# Patient Record
Sex: Male | Born: 1960 | Race: White | Hispanic: No | Smoking: Current every day smoker
Health system: Southern US, Community
[De-identification: ages and names within clinical notes are randomized; demographics above are authoritative.]

## PROBLEM LIST (undated history)

## (undated) DIAGNOSIS — F1911 Other psychoactive substance abuse, in remission: Secondary | ICD-10-CM

## (undated) DIAGNOSIS — G4730 Sleep apnea, unspecified: Secondary | ICD-10-CM

## (undated) DIAGNOSIS — K759 Inflammatory liver disease, unspecified: Secondary | ICD-10-CM

## (undated) DIAGNOSIS — F32A Depression, unspecified: Secondary | ICD-10-CM

## (undated) DIAGNOSIS — F419 Anxiety disorder, unspecified: Secondary | ICD-10-CM

## (undated) DIAGNOSIS — F329 Major depressive disorder, single episode, unspecified: Secondary | ICD-10-CM

## (undated) HISTORY — PX: OTHER SURGICAL HISTORY: SHX169

## (undated) HISTORY — DX: Anxiety disorder, unspecified: F41.9

## (undated) HISTORY — DX: Depression, unspecified: F32.A

## (undated) HISTORY — PX: INCISION / DRAINAGE HAND / FINGER: SUR695

## (undated) HISTORY — DX: Sleep apnea, unspecified: G47.30

## (undated) HISTORY — DX: Major depressive disorder, single episode, unspecified: F32.9

---

## 2000-01-20 ENCOUNTER — Encounter: Payer: Self-pay | Admitting: Emergency Medicine

## 2000-01-20 ENCOUNTER — Emergency Department (HOSPITAL_COMMUNITY): Admission: EM | Admit: 2000-01-20 | Discharge: 2000-01-20 | Payer: Self-pay | Admitting: Emergency Medicine

## 2000-03-16 ENCOUNTER — Emergency Department (HOSPITAL_COMMUNITY): Admission: EM | Admit: 2000-03-16 | Discharge: 2000-03-16 | Payer: Self-pay | Admitting: Emergency Medicine

## 2001-10-23 ENCOUNTER — Ambulatory Visit (HOSPITAL_COMMUNITY): Admission: RE | Admit: 2001-10-23 | Discharge: 2001-10-23 | Payer: Self-pay | Admitting: Gastroenterology

## 2002-03-16 ENCOUNTER — Emergency Department (HOSPITAL_COMMUNITY): Admission: EM | Admit: 2002-03-16 | Discharge: 2002-03-16 | Payer: Self-pay | Admitting: Emergency Medicine

## 2003-08-14 ENCOUNTER — Encounter: Admission: RE | Admit: 2003-08-14 | Discharge: 2003-08-14 | Payer: Self-pay | Admitting: Neurology

## 2005-04-17 ENCOUNTER — Emergency Department (HOSPITAL_COMMUNITY): Admission: EM | Admit: 2005-04-17 | Discharge: 2005-04-17 | Payer: Self-pay | Admitting: Emergency Medicine

## 2005-11-01 ENCOUNTER — Ambulatory Visit: Payer: Self-pay | Admitting: Internal Medicine

## 2005-11-03 ENCOUNTER — Ambulatory Visit (HOSPITAL_COMMUNITY): Admission: RE | Admit: 2005-11-03 | Discharge: 2005-11-03 | Payer: Self-pay | Admitting: Internal Medicine

## 2005-12-01 ENCOUNTER — Ambulatory Visit: Payer: Self-pay | Admitting: Internal Medicine

## 2006-04-11 ENCOUNTER — Ambulatory Visit: Payer: Self-pay | Admitting: Gastroenterology

## 2007-05-08 ENCOUNTER — Emergency Department (HOSPITAL_COMMUNITY): Admission: EM | Admit: 2007-05-08 | Discharge: 2007-05-08 | Payer: Self-pay | Admitting: Emergency Medicine

## 2007-05-08 ENCOUNTER — Encounter: Payer: Self-pay | Admitting: Internal Medicine

## 2007-05-08 LAB — CONVERTED CEMR LAB
ALT: 119 units/L
BUN: 14 mg/dL
Calcium: 9.2 mg/dL
Eosinophils Relative: 2 %
Glucose, Bld: 88 mg/dL
HCT: 44.4 %
Hemoglobin: 15.8 g/dL
MCV: 88.5 fL
Monocytes Relative: 13 %
Total Bilirubin: 0.6 mg/dL
Total Protein: 7.1 g/dL

## 2010-04-06 ENCOUNTER — Encounter: Payer: Self-pay | Admitting: Internal Medicine

## 2010-04-06 DIAGNOSIS — B1710 Acute hepatitis C without hepatic coma: Secondary | ICD-10-CM | POA: Insufficient documentation

## 2010-04-06 DIAGNOSIS — F17200 Nicotine dependence, unspecified, uncomplicated: Secondary | ICD-10-CM | POA: Insufficient documentation

## 2010-04-06 DIAGNOSIS — F329 Major depressive disorder, single episode, unspecified: Secondary | ICD-10-CM | POA: Insufficient documentation

## 2010-04-06 DIAGNOSIS — Z9189 Other specified personal risk factors, not elsewhere classified: Secondary | ICD-10-CM | POA: Insufficient documentation

## 2010-04-06 DIAGNOSIS — F1021 Alcohol dependence, in remission: Secondary | ICD-10-CM | POA: Insufficient documentation

## 2010-04-06 DIAGNOSIS — R197 Diarrhea, unspecified: Secondary | ICD-10-CM | POA: Insufficient documentation

## 2010-04-06 DIAGNOSIS — K219 Gastro-esophageal reflux disease without esophagitis: Secondary | ICD-10-CM | POA: Insufficient documentation

## 2010-04-07 ENCOUNTER — Ambulatory Visit: Admit: 2010-04-07 | Payer: Self-pay | Admitting: Internal Medicine

## 2010-05-25 ENCOUNTER — Other Ambulatory Visit: Payer: Self-pay | Admitting: Gastroenterology

## 2010-05-25 DIAGNOSIS — B182 Chronic viral hepatitis C: Secondary | ICD-10-CM

## 2010-06-01 ENCOUNTER — Ambulatory Visit
Admission: RE | Admit: 2010-06-01 | Discharge: 2010-06-01 | Disposition: A | Discharge status: Home / Self Care | Payer: Medicaid Other | Source: Ambulatory Visit | Attending: Gastroenterology | Admitting: Gastroenterology

## 2010-06-01 DIAGNOSIS — B182 Chronic viral hepatitis C: Secondary | ICD-10-CM

## 2010-06-03 ENCOUNTER — Other Ambulatory Visit: Payer: Self-pay | Admitting: Gastroenterology

## 2010-06-03 ENCOUNTER — Other Ambulatory Visit (HOSPITAL_COMMUNITY): Payer: Self-pay | Admitting: Gastroenterology

## 2010-06-03 DIAGNOSIS — B182 Chronic viral hepatitis C: Secondary | ICD-10-CM

## 2010-06-10 ENCOUNTER — Other Ambulatory Visit: Payer: Self-pay | Admitting: Gastroenterology

## 2010-06-10 ENCOUNTER — Other Ambulatory Visit: Payer: Self-pay | Admitting: Diagnostic Radiology

## 2010-06-10 ENCOUNTER — Ambulatory Visit (HOSPITAL_COMMUNITY): Payer: Medicaid Other

## 2010-06-10 ENCOUNTER — Ambulatory Visit (HOSPITAL_COMMUNITY)
Admission: RE | Admit: 2010-06-10 | Discharge: 2010-06-10 | Disposition: A | Discharge status: Home / Self Care | Payer: Medicaid Other | Source: Ambulatory Visit | Attending: Gastroenterology | Admitting: Gastroenterology

## 2010-06-10 DIAGNOSIS — B182 Chronic viral hepatitis C: Secondary | ICD-10-CM | POA: Insufficient documentation

## 2010-06-10 DIAGNOSIS — Z01812 Encounter for preprocedural laboratory examination: Secondary | ICD-10-CM | POA: Insufficient documentation

## 2010-06-10 LAB — CBC
HCT: 44.1 % (ref 39.0–52.0)
MCH: 30.9 pg (ref 26.0–34.0)
MCHC: 34.2 g/dL (ref 30.0–36.0)
MCV: 90.4 fL (ref 78.0–100.0)
RDW: 13.4 % (ref 11.5–15.5)

## 2010-08-13 NOTE — Assessment & Plan Note (Signed)
[facility]                           GASTROENTEROLOGY OFFICE NOTE   Todd Choi, Todd Choi                     MRN:          [id number]  DATE:11/01/2005                            DOB:          September 13, 1960    REFERRING PHYSICIAN:  The patient is self-referred.   REASON FOR CONSULTATION:  1.  History of hepatitis C.  2.  Nausea with vomiting and diarrhea.   HISTORY OF PRESENT ILLNESS:  This is a 50 year old [place] male who presents  today with the above listed GI complaints.  Also complaints regarding  history of substance abuse and depression.  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  He had been under the care of Dr. Sharrell Ku, but has elected to change his care for personal reasons.  He did not  have any conflicts with Dr. Kinnie Scales.  There are no accompanying medical  records at this point.  I was able to pull up an upper endoscopy and  colonoscopy report from October 23, 2001.  The examinations were being done for  reflux symptoms, diarrhea and rectal bleeding.  Also the patient's father  had a history of amyloidosis.  Upper endoscopy revealed ulcerative  esophagitis as well as antral erosions.  Colonoscopy was grossly normal.  Biopsies from the right colon suggested mild lymphocytic colitis.  Amyloid  testing was negative.  The patient was placed on a PPI.  Since that time he  has had ongoing problems with drug abuse.  He names cocaine.  He has been in  and out of rehabilitation centers with variable compliance.  Also  significant alcohol abuse, which is ongoing.  He feels poorly, he feels  depressed and wants to clean up his act, as he is now remarried with a young  child and a second child expected.  He requests a psychiatric referral.  In  terms of his GI complaints, he reports problems with morning nausea and  vomiting, particularly after breakfast.  This occurs 7 out of 10 days.  No  bleeding.  Next he reports  swelling or bloating of his abdomen.  His weight  has increased, and he is concerned about fluid in the abdomen.  Finally, he  continues with diarrhea, 3 to 4 watery stools after breakfast, and 1 to 3  loose stools throughout the remainder of the day.  No blood or mucus.   FAMILY HISTORY:  Father with amyloidosis.   SOCIAL HISTORY:  The patient is remarried.  He has two children and one  expected child.  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  He smokes a half a pack of cigarettes per day, drinks wine 3  or 4 times per day and has used cocaine in the past, but states he is not  using currently.   REVIEW OF SYSTEMS:  Per diagnostic evaluation form.   ALLERGIES:  No known drug allergies.   CURRENT MEDICATIONS:  None.   PHYSICAL EXAMINATION:  GENERAL:  Somewhat worn out, anxious gentleman in no  acute distress.  VITAL SIGNS:  Blood pressure  140/80, heart rate is 88, weight is 202 pounds.  He is 6 feet in height.  HEENT:  Sclerae are anicteric, conjunctivae are pink, oral mucosa intact,  tongue hue is normal.  LUNGS:  Clear.  HEART:  Regular.  ABDOMEN:  Soft and nontender with good bowel sounds, no obvious  organomegaly, mass or hernia.  The flanks are slightly bulging, suggesting  fluid.  RECTAL:  Exam is omitted.  EXTREMITIES:  Do not reveal edema.  NEUROLOGIC:  He is intact with no evidence of asterixis.  SKIN:  Exam reveals some excoriations in the left upper extremity.   IMPRESSION:  1.  Problems with nausea and vomiting, likely due to reflux disease.  Prior      history of erosive esophagitis.  2.  Bloating and swelling of the abdomen.  Rule out ascites.  3.  History of hepatitis C.  4.  Chronic diarrhea with prior biopsies suggesting lymphocytic colitis.  5.  Depression, substance abuse.   RECOMMENDATIONS:  1.  Daily proton pump inhibitor.  Samples and a prescription have been      provided.  2.  Abdominal ultrasound to rule out  ascites.  3.  Laboratories today including CBC, comprehensive metabolic panel and      prothrombin time.  4.  Solicit outside records for review.  5.  Lomotil for diarrhea.  6.  Psychiatric referral for depression and substance abuse.  7.  Return to the office in 2 weeks to review the above studies and assess      response to treatment.                                   Wilhemina Bonito. Eda Keys., MD   JNP/MedQ  DD:  11/01/2005  DT:  11/02/2005  Job #:  [id number]

## 2010-08-13 NOTE — Assessment & Plan Note (Signed)
[facility]                           GASTROENTEROLOGY OFFICE NOTE   Todd Choi, HOUSEL                     MRN:          [id number]  DATE:12/01/2005                            DOB:          1960/08/25    HISTORY:  Todd Choi presents today for a followup.  He is a 50 year old with  chronic hepatitis C, gastroesophageal reflux disease, depression, and a  history of substance abuse, who was evaluated on November 01, 2005 regarding  these issues.  His problems with nausea and vomiting were felt secondary to  reflux disease.  He was placed on Nexium with resolution of symptoms.  He  complained of bloating and swelling of the abdomen and was concerned that  this might represent ascites.  An abdominal ultrasound performed on November 03, 2005 revealed no evidence of ascites.  There was increased echogenicity  of the liver, consistent with known hepatic parenchymal disease.  Patient  also had blood work obtained.  A CBC revealed hemoglobin of 16, MCV 90.6,  white blood cell count 11.2, and total platelet count of 241,000.  His  prothrombin time was normal at 11.3 seconds.  Basic chemistries were normal  except for a glucose of 150.  Liver function tests revealed an abnormal SGOT  of 64 and an SGPT of 148.  Alkaline phosphatase, bilirubin, protein, and  albumin were all normal.  Finally, the patient complained of chronic  diarrhea which has been worked up previously.  Prior colonic biopsies  revealed mild chronic colitis with lymphocytes without specific diagnosis  rendered.  Patient also requested some assistance regarding his problems  with depression and substance abuse as well as possible treatment for his  hepatitis C.  Imodium for his diarrhea has not been helpful.  He states that  he has not used alcohol or drugs in two weeks.  We have reviewed all of the  above studies.   PHYSICAL EXAMINATION:  GENERAL:  Limited physical exam today finds a well-  appearing male in no acute distress.  VITAL SIGNS:  Blood pressure 100/62, heart rate 72.  Weight is 202 pounds.   IMPRESSION:  1. Chronic hepatitis C without evidence of decompensated liver disease by      history, physical examination, laboratories, or ultrasound, although      the patient should be evaluated and considered for antiviral therapy, I      discussed with him the limitations or possible problems as it relates      to his history of depression and substance abuse.  In any event, his      evaluation should occur with the hepatology experts from the [facility] liver      clinic.  2. Gastroesophageal reflux disease with a history of erosive change on      endoscopy:  Symptoms improved on proton pump inhibitor therapy.  3. Chronic diarrhea, possibly due to lymphocytic colitis:  No improvement      on low dose Imodium.  4. History of depression and substance abuse:  Recent abstinence.      Requests psychiatric referral.   RECOMMENDATIONS:  1. Continue daily proton  pump inhibitor therapy for reflux disease.      Multiple samples of Protonix provided.  2. Change from Imodium to Lomotil 1-2 p.o. q.6h. p.r.n. diarrhea.  3. Schedule psychiatry appointment with Dr. Ellamae Sia regarding      problems with substance abuse and depression.  4. Schedule appointment with [facility] medical specialties clinic regarding      evaluation and possible treatment of hepatitis C.  5. Structured exercise regimen recommended.  Names of different facilities      and trainers provided.  6. GI followup in this office in about two months.                                   Todd Choi., MD   JNP/MedQ  DD:  12/01/2005  DT:  12/01/2005  Job #:  [id number]   cc:   Daine Floras, M.D.  [facility] hepatitis clinic

## 2010-09-02 ENCOUNTER — Ambulatory Visit: Payer: Self-pay | Admitting: Gastroenterology

## 2010-12-17 LAB — DIFFERENTIAL
Basophils Absolute: 0
Basophils Relative: 1
Eosinophils Absolute: 0.2
Eosinophils Relative: 2
Lymphocytes Relative: 36
Lymphs Abs: 2.6
Monocytes Absolute: 0.9
Monocytes Relative: 13 — ABNORMAL HIGH
Neutro Abs: 3.5
Neutrophils Relative %: 49

## 2010-12-17 LAB — CBC
HCT: 44.4
Hemoglobin: 15.8
MCHC: 35.5
MCV: 88.5
Platelets: 238
RBC: 5.02
RDW: 12.6
WBC: 7.2

## 2010-12-17 LAB — COMPREHENSIVE METABOLIC PANEL
ALT: 119 — ABNORMAL HIGH
AST: 50 — ABNORMAL HIGH
Albumin: 3.8
Calcium: 9.2
GFR calc Af Amer: >60
Glucose, Bld: 88
Sodium: 138
Total Protein: 7.1

## 2010-12-17 LAB — SAMPLE TO BLOOD BANK

## 2010-12-17 LAB — URINALYSIS, ROUTINE W REFLEX MICROSCOPIC
Bilirubin Urine: NEGATIVE
Glucose, UA: NEGATIVE
Hgb urine dipstick: NEGATIVE
Ketones, ur: NEGATIVE
Nitrite: NEGATIVE
Protein, ur: NEGATIVE
Specific Gravity, Urine: 1.019
Urobilinogen, UA: 0.2
pH: 8

## 2010-12-17 LAB — COMPREHENSIVE METABOLIC PANEL WITH GFR
Alkaline Phosphatase: 60
BUN: 14
CO2: 30
Chloride: 102
Creatinine, Ser: 0.72
GFR calc non Af Amer: >60
Potassium: 4.4
Total Bilirubin: 0.6

## 2010-12-17 LAB — LIPASE, BLOOD: Lipase: 36

## 2011-03-03 ENCOUNTER — Other Ambulatory Visit: Payer: Self-pay | Admitting: Family Medicine

## 2011-03-03 DIAGNOSIS — M21372 Foot drop, left foot: Secondary | ICD-10-CM

## 2011-03-04 ENCOUNTER — Inpatient Hospital Stay
Admission: RE | Admit: 2011-03-04 | Discharge: 2011-03-04 | Payer: Medicaid Other | Source: Ambulatory Visit | Attending: Family Medicine | Admitting: Family Medicine

## 2011-03-08 ENCOUNTER — Inpatient Hospital Stay: Admission: RE | Admit: 2011-03-08 | Payer: Medicaid Other | Source: Ambulatory Visit

## 2011-03-13 ENCOUNTER — Ambulatory Visit
Admission: RE | Admit: 2011-03-13 | Discharge: 2011-03-13 | Disposition: A | Discharge status: Home / Self Care | Payer: Medicaid Other | Source: Ambulatory Visit | Attending: Family Medicine | Admitting: Family Medicine

## 2011-03-13 DIAGNOSIS — M21372 Foot drop, left foot: Secondary | ICD-10-CM

## 2011-03-19 ENCOUNTER — Ambulatory Visit
Admission: RE | Admit: 2011-03-19 | Discharge: 2011-03-19 | Disposition: A | Discharge status: Home / Self Care | Payer: Medicaid Other | Source: Ambulatory Visit | Attending: Family Medicine | Admitting: Family Medicine

## 2011-03-19 MED ORDER — GADOBENATE DIMEGLUMINE 529 MG/ML IV SOLN
15.0000 mL | Freq: Once | INTRAVENOUS | Status: AC | PRN
  Administered 2011-03-19: 15 mL via INTRAVENOUS

## 2011-05-24 ENCOUNTER — Other Ambulatory Visit: Payer: Self-pay | Admitting: Gastroenterology

## 2011-05-24 DIAGNOSIS — B182 Chronic viral hepatitis C: Secondary | ICD-10-CM

## 2011-06-06 ENCOUNTER — Other Ambulatory Visit: Payer: Medicaid Other

## 2011-06-09 ENCOUNTER — Ambulatory Visit
Admission: RE | Admit: 2011-06-09 | Discharge: 2011-06-09 | Disposition: A | Discharge status: Home / Self Care | Payer: Medicaid Other | Source: Ambulatory Visit | Attending: Gastroenterology | Admitting: Gastroenterology

## 2011-06-09 DIAGNOSIS — B182 Chronic viral hepatitis C: Secondary | ICD-10-CM

## 2012-03-29 ENCOUNTER — Emergency Department (HOSPITAL_COMMUNITY)
Admission: EM | Admit: 2012-03-29 | Discharge: 2012-03-29 | Disposition: A | Discharge status: Home / Self Care | Payer: Self-pay | Source: Home / Self Care

## 2012-03-29 ENCOUNTER — Encounter (HOSPITAL_COMMUNITY): Payer: Self-pay

## 2012-03-29 DIAGNOSIS — F1021 Alcohol dependence, in remission: Secondary | ICD-10-CM

## 2012-03-29 DIAGNOSIS — Z9189 Other specified personal risk factors, not elsewhere classified: Secondary | ICD-10-CM

## 2012-03-29 DIAGNOSIS — M549 Dorsalgia, unspecified: Secondary | ICD-10-CM

## 2012-03-29 DIAGNOSIS — R197 Diarrhea, unspecified: Secondary | ICD-10-CM

## 2012-03-29 MED ORDER — KETOROLAC TROMETHAMINE 60 MG/2ML IM SOLN
INTRAMUSCULAR | Status: AC
  Filled 2012-03-29: qty 2

## 2012-03-29 MED ORDER — KETOROLAC TROMETHAMINE 60 MG/2ML IM SOLN
60.0000 mg | Freq: Once | INTRAMUSCULAR | Status: AC
  Administered 2012-03-29: 60 mg via INTRAMUSCULAR

## 2012-03-29 NOTE — ED Provider Notes (Signed)
History     CSN: [id number]  Arrival date & time 03/29/12  1525   Chief Complaint  Patient presents with  . Back Pain   HPI Pt has been having some musculoskeletal pain in the left scapula area and he reports that he is having trouble positioning himself without pain.  He is taking OTC ibuprofen and tylenol with only minimal relief.  Pt says that he is recovering from cocaine (15 days) and is going through withdrawals.  He does not want to take any narcotic or opioid medications.      History reviewed. No pertinent past medical history.  History reviewed. No pertinent past surgical history.  No family history on file.  History  Substance Use Topics  . Smoking status: Heavy Tobacco Smoker  . Smokeless tobacco: Not on file  . Alcohol Use: No    Review of Systems  Constitutional: Negative.   HENT: Negative.   Eyes: Negative.   Respiratory: Negative.   Genitourinary: Negative.   Musculoskeletal: Positive for back pain. Negative for myalgias and joint swelling.  Neurological: Negative.   Psychiatric/Behavioral: Positive for agitation. Negative for hallucinations, confusion, dysphoric mood and decreased concentration.    Allergies  Review of patient's allergies indicates no known allergies.  Home Medications  No current outpatient prescriptions on file.  BP 126/93  Pulse 79  Temp 98.2 F (36.8 C) (Oral)  Resp 19  SpO2 97%  Physical Exam  Constitutional: He is oriented to person, place, and time. He appears well-developed and well-nourished. No distress.       Pt figeting and shaking some from withdrawal  HENT:  Head: Normocephalic and atraumatic.  Eyes: EOM are normal. Pupils are equal, round, and reactive to light.  Neck: Normal range of motion. Neck supple.  Cardiovascular: Normal rate, regular rhythm and normal heart sounds.   Pulmonary/Chest: Effort normal and breath sounds normal.  Abdominal: Soft. Bowel sounds are normal.  Musculoskeletal: Normal range of  motion. He exhibits tenderness.       Arms: Neurological: He is alert and oriented to person, place, and time.  Skin: Skin is warm and dry. No rash noted. No erythema. No pallor.  Psychiatric: He has a normal mood and affect. His behavior is normal. Judgment and thought content normal.    ED Course  Procedures (including critical care time)  Labs Reviewed - No data to display No results found.   No diagnosis found.   MDM  IMPRESSION  MS sprain left subscapularis  Cocaine withdrawal   RECOMMENDATIONS / PLAN  Torodol 30 mg IM Ibuprofen 800mg  every 8 hours around the clock Tylenol ES every 4 hours  HEAT/ICE applied to area   FOLLOW UP If no improvement in 3 days   The patient was given clear instructions to go to ER or return to medical center if symptoms don't improve, worsen or new problems develop.  The patient verbalized understanding.  The patient was told to call to get lab results if they haven't heard anything in the next week.            Cleora Fleet, MD 03/29/12 1907

## 2012-03-29 NOTE — ED Notes (Signed)
Complain of back pain times 1 week. Turned the wrong way in the shower and has been hurting.  Has a past history of drug use ( cocaine) only 15 days clean

## 2013-06-26 ENCOUNTER — Telehealth: Payer: Self-pay | Admitting: Hematology and Oncology

## 2013-06-26 NOTE — Telephone Encounter (Signed)
CALLED PATIENT TO CALL TO SCHEDULE NP APPT PER PATIENT AT WORK WILL CALL BACK.

## 2015-03-02 ENCOUNTER — Other Ambulatory Visit (HOSPITAL_COMMUNITY): Payer: Self-pay | Admitting: Nurse Practitioner

## 2015-03-02 DIAGNOSIS — B182 Chronic viral hepatitis C: Secondary | ICD-10-CM

## 2015-03-31 ENCOUNTER — Ambulatory Visit (HOSPITAL_COMMUNITY)
Admission: RE | Admit: 2015-03-31 | Discharge: 2015-03-31 | Disposition: A | Discharge status: Home / Self Care | Payer: BLUE CROSS/BLUE SHIELD | Source: Ambulatory Visit | Attending: Nurse Practitioner | Admitting: Nurse Practitioner

## 2015-03-31 DIAGNOSIS — B182 Chronic viral hepatitis C: Secondary | ICD-10-CM

## 2015-04-20 ENCOUNTER — Ambulatory Visit (HOSPITAL_COMMUNITY): Payer: BLUE CROSS/BLUE SHIELD

## 2015-04-30 ENCOUNTER — Ambulatory Visit (HOSPITAL_COMMUNITY)
Admission: RE | Admit: 2015-04-30 | Discharge: 2015-04-30 | Disposition: A | Discharge status: Home / Self Care | Payer: BLUE CROSS/BLUE SHIELD | Source: Ambulatory Visit | Attending: Nurse Practitioner | Admitting: Nurse Practitioner

## 2015-04-30 DIAGNOSIS — B182 Chronic viral hepatitis C: Secondary | ICD-10-CM | POA: Insufficient documentation

## 2015-04-30 DIAGNOSIS — N281 Cyst of kidney, acquired: Secondary | ICD-10-CM | POA: Diagnosis not present

## 2015-04-30 DIAGNOSIS — K8020 Calculus of gallbladder without cholecystitis without obstruction: Secondary | ICD-10-CM | POA: Insufficient documentation

## 2016-02-25 ENCOUNTER — Encounter (HOSPITAL_COMMUNITY): Payer: Self-pay

## 2016-02-25 ENCOUNTER — Emergency Department (HOSPITAL_COMMUNITY): Payer: BLUE CROSS/BLUE SHIELD

## 2016-02-25 ENCOUNTER — Emergency Department (HOSPITAL_COMMUNITY)
Admission: EM | Admit: 2016-02-25 | Discharge: 2016-02-26 | Disposition: A | Discharge status: Home / Self Care | Payer: BLUE CROSS/BLUE SHIELD | Attending: Emergency Medicine | Admitting: Emergency Medicine

## 2016-02-25 DIAGNOSIS — F17200 Nicotine dependence, unspecified, uncomplicated: Secondary | ICD-10-CM | POA: Insufficient documentation

## 2016-02-25 DIAGNOSIS — L089 Local infection of the skin and subcutaneous tissue, unspecified: Secondary | ICD-10-CM

## 2016-02-25 DIAGNOSIS — L02511 Cutaneous abscess of right hand: Secondary | ICD-10-CM | POA: Insufficient documentation

## 2016-02-25 DIAGNOSIS — Z79899 Other long term (current) drug therapy: Secondary | ICD-10-CM | POA: Insufficient documentation

## 2016-02-25 MED ORDER — HYDROCODONE-ACETAMINOPHEN 5-325 MG PO TABS
2.0000 | ORAL_TABLET | Freq: Once | ORAL | Status: AC
  Administered 2016-02-25: 2 via ORAL
  Filled 2016-02-25: qty 2

## 2016-02-25 NOTE — ED Triage Notes (Signed)
Pt states that he burned his finger a couple of weeks ago. Now, pt is c/o finger pain. Pt seen at Urgent Care this morning and given antibiotics and pain medication, but he states that the pain and swelling is increasing. A&Ox4. Ambulatory.

## 2016-02-26 MED ORDER — BACITRACIN ZINC 500 UNIT/GM EX OINT
TOPICAL_OINTMENT | CUTANEOUS | Status: AC
  Filled 2016-02-26: qty 0.9

## 2016-02-26 MED ORDER — CEPHALEXIN 500 MG PO CAPS: 500.0000 mg | ORAL_CAPSULE | Freq: Four times a day (QID) | ORAL | 0 refills | Status: DC

## 2016-02-26 MED ORDER — LIDOCAINE HCL (PF) 1 % IJ SOLN
30.0000 mL | Freq: Once | INTRAMUSCULAR | Status: AC
  Administered 2016-02-26: 30 mL
  Filled 2016-02-26: qty 30

## 2016-02-26 NOTE — ED Provider Notes (Signed)
[facility] DEPT Provider Note   CSN: [id number] Arrival date & time: 02/25/16  2232     History   Chief Complaint Chief Complaint  Patient presents with  . Hand Pain    L    HPI Todd Choi is a 55 y.o. male who presents with right small finger pain and swelling. Patient reports he burned his finger around 3 weeks ago while cooking. Patient is a Biomedical scientist. Patient admits to not taking care of the burn and it has become infected. Patient reports increasing pain and swelling over the past 3 days. Patient has not been able to sleep due to the pain. Patient was seen in urgent care earlier today and was discharged home with Bactrim and ibuprofen for pain. The urgent care provider was uncomfortable doing an I&D for the patient. Patient denies any fevers or pain elsewhere. Patient has not been taking any other medications besides those prescribed this morning.  HPI  History reviewed. No pertinent past medical history.  Patient Active Problem List   Diagnosis Date Noted  . HEPATITIS C 04/06/2010  . SMOKER 04/06/2010  . DEPRESSION 04/06/2010  . GERD 04/06/2010  . DIARRHEA, CHRONIC 04/06/2010  . ALCOHOL ABUSE, HX OF 04/06/2010  . COCAINE ABUSE, HX OF 04/06/2010    History reviewed. No pertinent surgical history.     Home Medications    Prior to Admission medications   Medication Sig Start Date End Date Taking? Authorizing Provider  cephALEXin (KEFLEX) 500 MG capsule Take 1 capsule (500 mg total) by mouth 4 (four) times daily. 02/26/16   Frederica Kuster, PA-C    Family History History reviewed. No pertinent family history.  Social History Social History  Substance Use Topics  . Smoking status: Heavy Tobacco Smoker  . Smokeless tobacco: Not on file  . Alcohol use No     Allergies   Patient has no known allergies.   Review of Systems Review of Systems  Constitutional: Negative for chills and fever.  HENT: Negative for facial swelling and sore throat.     Respiratory: Negative for shortness of breath.   Cardiovascular: Negative for chest pain.  Gastrointestinal: Negative for abdominal pain, nausea and vomiting.  Genitourinary: Negative for dysuria.  Musculoskeletal: Positive for arthralgias and joint swelling. Negative for back pain.  Skin: Positive for color change and wound. Negative for rash.  Neurological: Negative for headaches.  Psychiatric/Behavioral: The patient is not nervous/anxious.      Physical Exam Updated Vital Signs BP 142/98 (BP Location: Left Arm)   Pulse 80   Temp 97.6 F (36.4 C) (Oral)   Resp 18   SpO2 99%   Physical Exam  Constitutional: He appears well-developed and well-nourished. No distress.  HENT:  Head: Normocephalic and atraumatic.  Mouth/Throat: Oropharynx is clear and moist. No oropharyngeal exudate.  Eyes: Conjunctivae are normal. Pupils are equal, round, and reactive to light. Right eye exhibits no discharge. Left eye exhibits no discharge. No scleral icterus.  Neck: Normal range of motion. Neck supple. No thyromegaly present.  Cardiovascular: Normal rate, regular rhythm, normal heart sounds and intact distal pulses.  Exam reveals no gallop and no friction rub.   No murmur heard. Pulmonary/Chest: Effort normal and breath sounds normal. No stridor. No respiratory distress. He has no wheezes. He has no rales.  Abdominal: Soft. Bowel sounds are normal. He exhibits no distension. There is no tenderness. There is no rebound and no guarding.  Musculoskeletal: He exhibits no edema.       Right  hand: He exhibits tenderness, bony tenderness and swelling. He exhibits normal capillary refill. Normal sensation noted. Normal strength noted.  Right small finger: Decreased range of motion to joint secondary to pain and swelling, however DIP and PIP flexion and extension intact; abduction and abduction intact; some fluctuance over wound on the dorsal aspect distal to the DIP; normal sensation; see image for more  details Erythema spreading to 5th metacarpal stopping at wrist  Lymphadenopathy:    He has no cervical adenopathy.  Neurological: He is alert. Coordination normal.  Skin: Skin is warm and dry. No rash noted. He is not diaphoretic. No pallor.  Psychiatric: He has a normal mood and affect.  Nursing note and vitals reviewed.        ED Treatments / Results  Labs (all labs ordered are listed, but only abnormal results are displayed) Labs Reviewed - No data to display  EKG  EKG Interpretation None       Radiology Dg Finger Little Right  Result Date: 02/25/2016 CLINICAL DATA:  Burn injury to right little finger EXAM: RIGHT LITTLE FINGER 2+V COMPARISON:  None. FINDINGS: The bones are osteopenic. There is distal interphalangeal joint space narrowing can't moderate soft tissue swelling. There is no focal osteolysis or other acute bone abnormality. IMPRESSION: 1. Normal no acute osseous abnormality of the right fifth digit. Moderate soft tissue swelling. 2. Moderate distal interphalangeal joint osetoarthrosis. Electronically Signed   By: Ulyses Jarred M.D.   On: 02/25/2016 23:38    Procedures Procedures (including critical care time)  Medications Ordered in ED Medications  bacitracin 500 UNIT/GM ointment (not administered)  HYDROcodone-acetaminophen (NORCO/VICODIN) 5-325 MG per tablet 2 tablet (2 tablets Oral Given 02/25/16 2344)  lidocaine (PF) (XYLOCAINE) 1 % injection 30 mL (30 mLs Infiltration Given 02/26/16 0115)     Initial Impression / Assessment and Plan / ED Course  I have reviewed the triage vital signs and the nursing notes.  Pertinent labs & imaging results that were available during my care of the patient were reviewed by me and considered in my medical decision making (see chart for details).  Clinical Course     Patient with abscess and cellulitis to right small finger. Incision and drainage performed in the ED today by Dr. Laverta Baltimore; see his note for procedure  note.  Abscess was not large enough to warrant packing or drain placement. Wound recheck in 2-3 days by PCP. Supportive care and return precautions discussed.  Pt sent home with Keflex in addition to Bactrim previously prescribed by urgent care. Follow-up to hand surgery, Dr. Amedeo Plenty, if symptoms are not improving over the next 5-7 days. Patient understands and agrees with plan. Patient vitals stable throughout ED course and discharged in satisfactory condition. Patient also evaluated by Dr. Laverta Baltimore who guided and assisted with the patient's management and agrees with plan.    Final Clinical Impressions(s) / ED Diagnoses   Final diagnoses:  Finger infection    New Prescriptions New Prescriptions   CEPHALEXIN (KEFLEX) 500 MG CAPSULE    Take 1 capsule (500 mg total) by mouth 4 (four) times daily.     Frederica Kuster, PA-C 02/26/16 0118    Margette Fast, MD 02/26/16 [phone]

## 2016-02-26 NOTE — ED Notes (Signed)
PA at bedside.

## 2016-02-26 NOTE — Discharge Instructions (Signed)
Continue taking Bactrim and ibuprofen as prescribed. Begin taking Keflex in addition to Bactrim. Make sure to finish all of these medications. Wash wounds with warm soapy water once daily. Apply antibiotic ointment once daily. Apply clean dressing. Please follow up with hand doctor, Dr. Amedeo Plenty, if your symptoms are not improving by the middle of next week. Please return to the emergency department if you develop any fever, increasing pain, swelling, drainage, streaking up your hand or arm.

## 2016-02-28 ENCOUNTER — Inpatient Hospital Stay (HOSPITAL_COMMUNITY)
Admission: EM | Admit: 2016-02-28 | Discharge: 2016-03-03 | DRG: 513 | Disposition: A | Discharge status: Home Health | Payer: Self-pay | Attending: Orthopedic Surgery | Admitting: Orthopedic Surgery

## 2016-02-28 ENCOUNTER — Encounter (HOSPITAL_COMMUNITY): Payer: Self-pay

## 2016-02-28 ENCOUNTER — Emergency Department (HOSPITAL_COMMUNITY): Payer: Self-pay

## 2016-02-28 DIAGNOSIS — F1410 Cocaine abuse, uncomplicated: Secondary | ICD-10-CM | POA: Diagnosis present

## 2016-02-28 DIAGNOSIS — L03011 Cellulitis of right finger: Secondary | ICD-10-CM

## 2016-02-28 DIAGNOSIS — A4901 Methicillin susceptible Staphylococcus aureus infection, unspecified site: Secondary | ICD-10-CM | POA: Diagnosis present

## 2016-02-28 DIAGNOSIS — M19041 Primary osteoarthritis, right hand: Secondary | ICD-10-CM | POA: Diagnosis present

## 2016-02-28 DIAGNOSIS — B9561 Methicillin susceptible Staphylococcus aureus infection as the cause of diseases classified elsewhere: Secondary | ICD-10-CM | POA: Diagnosis present

## 2016-02-28 DIAGNOSIS — B1920 Unspecified viral hepatitis C without hepatic coma: Secondary | ICD-10-CM | POA: Diagnosis present

## 2016-02-28 DIAGNOSIS — Z23 Encounter for immunization: Secondary | ICD-10-CM

## 2016-02-28 DIAGNOSIS — F17200 Nicotine dependence, unspecified, uncomplicated: Secondary | ICD-10-CM | POA: Diagnosis present

## 2016-02-28 DIAGNOSIS — L02512 Cutaneous abscess of left hand: Secondary | ICD-10-CM | POA: Diagnosis present

## 2016-02-28 DIAGNOSIS — M659 Synovitis and tenosynovitis, unspecified: Secondary | ICD-10-CM | POA: Diagnosis present

## 2016-02-28 DIAGNOSIS — M009 Pyogenic arthritis, unspecified: Secondary | ICD-10-CM | POA: Diagnosis present

## 2016-02-28 DIAGNOSIS — M0000 Staphylococcal arthritis, unspecified joint: Principal | ICD-10-CM | POA: Diagnosis present

## 2016-02-28 HISTORY — DX: Other psychoactive substance abuse, in remission: F19.11

## 2016-02-28 HISTORY — DX: Inflammatory liver disease, unspecified: K75.9

## 2016-02-28 LAB — BASIC METABOLIC PANEL
ANION GAP: 7 (ref 5–15)
ANION GAP: 11 (ref 5–15)
BUN: 15 mg/dL (ref 6–20)
BUN: 13 mg/dL (ref 6–20)
CALCIUM: 9.5 mg/dL (ref 8.9–10.3)
CO2: 24 mmol/L (ref 22–32)
CO2: 22 mmol/L (ref 22–32)
CREATININE: 0.82 mg/dL (ref 0.61–1.24)
Calcium: 9.5 mg/dL (ref 8.9–10.3)
Chloride: 106 mmol/L (ref 101–111)
Chloride: 102 mmol/L (ref 101–111)
Creatinine, Ser: 0.84 mg/dL (ref 0.61–1.24)
GLUCOSE: 106 mg/dL — AB (ref 65–99)
Glucose, Bld: 105 mg/dL — ABNORMAL HIGH (ref 65–99)
POTASSIUM: 4.7 mmol/L (ref 3.5–5.1)
Potassium: 4.2 mmol/L (ref 3.5–5.1)
SODIUM: 137 mmol/L (ref 135–145)
Sodium: 135 mmol/L (ref 135–145)

## 2016-02-28 LAB — CBC WITH DIFFERENTIAL/PLATELET
BASOS ABS: 0 10*3/uL (ref 0.0–0.1)
Basophils Relative: 0 %
EOS ABS: 0.2 10*3/uL (ref 0.0–0.7)
EOS PCT: 2 %
HCT: 44.2 % (ref 39.0–52.0)
Hemoglobin: 15.1 g/dL (ref 13.0–17.0)
LYMPHS PCT: 26 %
Lymphs Abs: 2.9 10*3/uL (ref 0.7–4.0)
MCH: 30.2 pg (ref 26.0–34.0)
MCHC: 34.2 g/dL (ref 30.0–36.0)
MCV: 88.4 fL (ref 78.0–100.0)
Monocytes Absolute: 1 10*3/uL (ref 0.1–1.0)
Monocytes Relative: 9 %
Neutro Abs: 6.9 10*3/uL (ref 1.7–7.7)
Neutrophils Relative %: 63 %
PLATELETS: 231 10*3/uL (ref 150–400)
RBC: 5 MIL/uL (ref 4.22–5.81)
RDW: 13.7 % (ref 11.5–15.5)
WBC: 10.9 10*3/uL — AB (ref 4.0–10.5)

## 2016-02-28 MED ORDER — VITAMIN C 500 MG PO TABS
1000.0000 mg | ORAL_TABLET | Freq: Every day | ORAL | Status: DC
  Administered 2016-03-01 – 2016-03-03 (×3): 1000 mg via ORAL
  Filled 2016-02-28 (×4): qty 2

## 2016-02-28 MED ORDER — ONDANSETRON HCL 4 MG PO TABS: 4.0000 mg | ORAL_TABLET | Freq: Four times a day (QID) | ORAL | Status: DC | PRN

## 2016-02-28 MED ORDER — PANTOPRAZOLE SODIUM 40 MG PO TBEC
40.0000 mg | DELAYED_RELEASE_TABLET | Freq: Two times a day (BID) | ORAL | Status: DC | PRN
Start: 2016-02-28 — End: 2016-03-03

## 2016-02-28 MED ORDER — METHOCARBAMOL 1000 MG/10ML IJ SOLN: 500.0000 mg | Freq: Four times a day (QID) | INTRAVENOUS | Status: DC | PRN

## 2016-02-28 MED ORDER — PIPERACILLIN-TAZOBACTAM 3.375 G IVPB 30 MIN
3.3750 g | Freq: Once | INTRAVENOUS | Status: AC
  Administered 2016-02-28: 3.375 g via INTRAVENOUS
  Filled 2016-02-28: qty 50

## 2016-02-28 MED ORDER — PIPERACILLIN-TAZOBACTAM 3.375 G IVPB
3.3750 g | Freq: Three times a day (TID) | INTRAVENOUS | Status: DC
  Administered 2016-02-29: 3.375 g via INTRAVENOUS
  Filled 2016-02-28 (×3): qty 50

## 2016-02-28 MED ORDER — NICOTINE 7 MG/24HR TD PT24
7.0000 mg | MEDICATED_PATCH | Freq: Every day | TRANSDERMAL | Status: DC
  Administered 2016-02-28: 7 mg via TRANSDERMAL
  Filled 2016-02-28 (×2): qty 1

## 2016-02-28 MED ORDER — ONDANSETRON HCL 4 MG/2ML IJ SOLN
4.0000 mg | Freq: Four times a day (QID) | INTRAMUSCULAR | Status: DC | PRN
  Administered 2016-03-01: 4 mg via INTRAVENOUS
  Filled 2016-02-28 (×2): qty 2

## 2016-02-28 MED ORDER — ALPRAZOLAM 0.5 MG PO TABS
0.5000 mg | ORAL_TABLET | Freq: Four times a day (QID) | ORAL | Status: DC | PRN
  Administered 2016-02-28: 0.5 mg via ORAL
  Filled 2016-02-28: qty 1

## 2016-02-28 MED ORDER — PROMETHAZINE HCL 25 MG RE SUPP: 12.5000 mg | Freq: Four times a day (QID) | RECTAL | Status: DC | PRN

## 2016-02-28 MED ORDER — VANCOMYCIN HCL IN DEXTROSE 1-5 GM/200ML-% IV SOLN
1000.0000 mg | Freq: Three times a day (TID) | INTRAVENOUS | Status: DC
  Administered 2016-02-29 – 2016-03-01 (×5): 1000 mg via INTRAVENOUS
  Filled 2016-02-28 (×7): qty 200

## 2016-02-28 MED ORDER — SODIUM CHLORIDE 0.45 % IV SOLN
INTRAVENOUS | Status: DC
  Administered 2016-02-28: 18:00:00 via INTRAVENOUS

## 2016-02-28 MED ORDER — HYDROMORPHONE HCL 2 MG/ML IJ SOLN
1.0000 mg | Freq: Once | INTRAMUSCULAR | Status: AC
Start: 2016-02-28 — End: 2016-02-28
  Administered 2016-02-28: 1 mg via INTRAVENOUS
  Filled 2016-02-28: qty 1

## 2016-02-28 MED ORDER — HYDROMORPHONE HCL 2 MG/ML IJ SOLN
0.5000 mg | INTRAMUSCULAR | Status: DC | PRN
  Administered 2016-02-28: 1 mg via INTRAVENOUS
  Filled 2016-02-28: qty 1

## 2016-02-28 MED ORDER — TEMAZEPAM 15 MG PO CAPS
15.0000 mg | ORAL_CAPSULE | Freq: Every evening | ORAL | Status: DC | PRN
  Administered 2016-02-28: 15 mg via ORAL
  Filled 2016-02-28: qty 1

## 2016-02-28 MED ORDER — LIDOCAINE HCL (PF) 1 % IJ SOLN
30.0000 mL | Freq: Once | INTRAMUSCULAR | Status: AC
  Administered 2016-02-28: 30 mL via INTRADERMAL
  Filled 2016-02-28: qty 30

## 2016-02-28 MED ORDER — OXYCODONE HCL 5 MG PO TABS
5.0000 mg | ORAL_TABLET | ORAL | Status: DC | PRN
  Administered 2016-02-28 – 2016-03-02 (×8): 10 mg via ORAL
  Filled 2016-02-28 (×8): qty 2

## 2016-02-28 MED ORDER — MORPHINE SULFATE (PF) 4 MG/ML IV SOLN
4.0000 mg | Freq: Once | INTRAVENOUS | Status: AC
  Administered 2016-02-28: 4 mg via INTRAVENOUS
  Filled 2016-02-28: qty 1

## 2016-02-28 MED ORDER — VANCOMYCIN HCL 10 G IV SOLR
1750.0000 mg | Freq: Once | INTRAVENOUS | Status: AC
  Administered 2016-02-28: 1750 mg via INTRAVENOUS
  Filled 2016-02-28: qty 1750

## 2016-02-28 MED ORDER — METHOCARBAMOL 500 MG PO TABS
500.0000 mg | ORAL_TABLET | Freq: Four times a day (QID) | ORAL | Status: DC | PRN
  Administered 2016-03-01 – 2016-03-02 (×4): 500 mg via ORAL
  Filled 2016-02-28 (×4): qty 1

## 2016-02-28 MED ORDER — BUPIVACAINE HCL (PF) 0.25 % IJ SOLN
10.0000 mL | Freq: Once | INTRAMUSCULAR | Status: AC
  Administered 2016-02-28: 10 mL
  Filled 2016-02-28: qty 30

## 2016-02-28 MED ORDER — SENNA 8.6 MG PO TABS
1.0000 | ORAL_TABLET | Freq: Two times a day (BID) | ORAL | Status: DC
  Administered 2016-02-28 – 2016-03-01 (×4): 8.6 mg via ORAL
  Filled 2016-02-28 (×6): qty 1

## 2016-02-28 MED ORDER — ADULT MULTIVITAMIN W/MINERALS CH
1.0000 | ORAL_TABLET | Freq: Every day | ORAL | Status: DC
  Administered 2016-02-29 – 2016-03-03 (×4): 1 via ORAL
  Filled 2016-02-28 (×4): qty 1

## 2016-02-28 NOTE — ED Notes (Signed)
Pt being transported to x-ray

## 2016-02-28 NOTE — ED Notes (Signed)
Permit signed for I and D of finger.

## 2016-02-28 NOTE — Progress Notes (Signed)
Pharmacy Antibiotic Note  Todd Choi is a 55 y.o. male admitted on 02/28/2016 with post-op orthopedic hand surgery. Failed outpatient Keflex and Bactrim. Pharmacy has been consulted for vancomycin/zosyn dosing. Afebrile, wbc 10.9. SCr 0.84, CrCL>100.  No other antibiotics charted prior to procedure.  Plan: Vancomycin 1750mg  IV x1; then 1g IV q8h Zosyn 3.375g IV (54min inf) x1; then 3.375g IV q8h (4h inf) Monitor clinical progress, c/s, renal function, abx plan/LOT VT@SS  as indicated   Height: 6' (182.9 cm) Weight: 189 lb (85.7 kg) IBW/kg (Calculated) : 77.6  Temp (24hrs), Avg:98.5 F (36.9 C), Min:98 F (36.7 C), Max:99 F (37.2 C)   Recent Labs Lab 02/28/16 1141  WBC 10.9*  CREATININE 0.84    Estimated Creatinine Clearance: 109.1 mL/min (by C-G formula based on SCr of 0.84 mg/dL).    No Known Allergies  Elicia Lamp, PharmD, BCPS Clinical Pharmacist 02/28/2016 5:22 PM

## 2016-02-28 NOTE — ED Notes (Signed)
repaged hand to [place], Therapist, sports

## 2016-02-28 NOTE — ED Notes (Signed)
On-Call Doctor from Hand Surgery returned called. Advised me to utilize current written admitting order for pain medicine to deal with patient's severe pain.

## 2016-02-28 NOTE — Progress Notes (Signed)
Orthopedic Tech Progress Note Patient Details:  Todd Choi 03-07-1961 [id number]  Ortho Devices Type of Ortho Device: Shoulder immobilizer Ortho Device/Splint Location: Applied Shoulder immobilzer to Right Arm.  Orders are located in "Other Orders" to keep Right hand elevated heart level.  Shoulder imoblizer is adjusted to keep hand at heart level. Provided pt with instruction for care.  Nurse Florian Buff was at bedside. Ortho Device/Splint Interventions: Application   [name] 02/28/2016, 6:42 PM

## 2016-02-28 NOTE — H&P (Signed)
Rylie Knierim is an 55 y.o. male.   Chief Complaint: Right small finger swelling and pain HPI: The patient is a pleasant 55 year old male who presents to the [facility] emergency room for evaluation of his right small finger distal tip. 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 States that he has bad habit of picking at his skin. Unfortunately, in the past week he is developed significant erythema, soft tissue swelling and pain about the distal phalanx of the right small finger. Initially seen at a local urgent care on November 30 where he had is consistent with an infectious distal phalanx he was started on Keflex unfortunately this progressively worsened in terms of the soft tissue swelling and pain and thus he was seen at [facility] emergency room where it appears he underwent an I&D  per the emergency room staff and was started on Bactrim. No definitive follow-up plan was made and the patient  has worsened with increasing pain swelling and fluctuance to the small finger. He was seen and evaluated by the [facility] emergency room staff and we were consulted in regards to his finger. Complains of pain and increasing swelling and redness. He denies fever or chills, other constitutional symptoms.  History reviewed. No pertinent past medical history.  History reviewed. No pertinent surgical history.  No family history on file. Social History:  reports that he has been smoking.  He does not have any smokeless tobacco history on file. He reports that he uses drugs, including Cocaine. He reports that he does not drink alcohol.  Allergies: No Known Allergies Dg Hand Complete Right  Result Date: 02/28/2016 CLINICAL DATA:  Pain to RIGHT little finger. Swollen and red. Burn injury. EXAM: RIGHT HAND - COMPLETE 3+ VIEW COMPARISON:  Fifth digit x-ray 02/25/2016. FINDINGS: There is no evidence of fracture or dislocation. Marked soft tissue swelling is noted particularly of  the fifth digit. 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 IMPRESSION: Marked soft tissue swelling.  No definite osseous reaction. Electronically Signed   By: Staci Righter M.D.   On: 02/28/2016 12:07    (Not in a hospital admission)  Results for orders placed or performed during the hospital encounter of 02/28/16 (from the past 48 hour(s))  CBC with Differential     Status: Abnormal   Collection Time: 02/28/16 11:41 AM  Result Value Ref Range   WBC 10.9 (H) 4.0 - 10.5 K/uL   RBC 5.00 4.22 - 5.81 MIL/uL   Hemoglobin 15.1 13.0 - 17.0 g/dL   HCT 44.2 39.0 - 52.0 %   MCV 88.4 78.0 - 100.0 fL   MCH 30.2 26.0 - 34.0 pg   MCHC 34.2 30.0 - 36.0 g/dL   RDW 13.7 11.5 - 15.5 %   Platelets 231 150 - 400 K/uL   Neutrophils Relative % 63 %   Neutro Abs 6.9 1.7 - 7.7 K/uL   Lymphocytes Relative 26 %   Lymphs Abs 2.9 0.7 - 4.0 K/uL   Monocytes Relative 9 %   Monocytes Absolute 1.0 0.1 - 1.0 K/uL   Eosinophils Relative 2 %   Eosinophils Absolute 0.2 0.0 - 0.7 K/uL   Basophils Relative 0 %   Basophils Absolute 0.0 0.0 - 0.1 K/uL  Basic metabolic panel     Status: Abnormal   Collection Time: 02/28/16 11:41 AM  Result Value Ref Range   Sodium 137 135 - 145 mmol/L   Potassium 4.7 3.5 - 5.1 mmol/L  Chloride 106 101 - 111 mmol/L   CO2 24 22 - 32 mmol/L   Glucose, Bld 105 (H) 65 - 99 mg/dL   BUN 15 6 - 20 mg/dL   Creatinine, Ser 0.84 0.61 - 1.24 mg/dL   Calcium 9.5 8.9 - 10.3 mg/dL   GFR calc non Af Amer >60 >60 mL/min   GFR calc Af Amer >60 >60 mL/min    Comment: (NOTE) The eGFR has been calculated using the CKD EPI equation. This calculation has not been validated in all clinical situations. eGFR's persistently <60 mL/min signify possible Chronic Kidney Disease.    Anion gap 7 5 - 15   Dg Hand Complete Right  Result Date: 02/28/2016 CLINICAL DATA:  Pain to RIGHT little finger. Swollen and red. Burn injury. EXAM: RIGHT HAND - COMPLETE 3+ VIEW COMPARISON:  Fifth digit x-ray  02/25/2016. FINDINGS: There is no evidence of fracture or dislocation. Marked soft tissue swelling is noted particularly of the fifth digit. 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 IMPRESSION: Marked soft tissue swelling.  No definite osseous reaction. Electronically Signed   By: Staci Righter M.D.   On: 02/28/2016 12:07    Review of Systems  Constitutional: Negative.   HENT: Negative.   Eyes: Negative.   Respiratory: Negative.   Cardiovascular: Negative.   Gastrointestinal: Negative.   Genitourinary: Negative.   Musculoskeletal:       See history of present illness  Endo/Heme/Allergies: Negative.   Psychiatric/Behavioral: Positive for substance abuse. The patient is nervous/anxious.        History of alcohol and cocaine abuse, denies current use    Blood pressure 120/79, pulse 73, temperature 98 F (36.7 C), temperature source Oral, resp. rate 18, height 6' (1.829 m), weight 85.7 kg (189 lb), SpO2 99 %. Physical Exam  The patient is alert and oriented in no acute distress. The patient complains of pain in the affected upper extremity.  The patient is noted to have a normal HEENT exam. Lung fields show equal chest expansion and no shortness of breath. Abdomen exam is nontender without distention. Lower extremity examination does not show any fracture dislocation or blood clot symptoms. Pelvis is stable and the neck and back are stable and nontender. Evaluation of the right small finger shows that he has circumflex swelling about the digit in a fusiform fashion with cellulitic changes present. He has a fluctuant area over the dorsal radial aspect of his DIP joint with pre-necrotic tissue and obvious purulence present. He is not overly tender about the flexor sheath and does not have this purulent flexor tenosynovitis, he is nontender about the thenar, mid palmar, hyperthenar space, so aspect of the hand is without significant swelling .  He is noted to have intermittent  excoriations about the wrist and forearm region currently non-complicated   Assessment/Plan Right small finger infection rule out septic joint   history of hepatitis C Patient Active Problem List   Diagnosis Date Noted  . HEPATITIS C 04/06/2010  . SMOKER 04/06/2010  . DEPRESSION 04/06/2010  . GERD 04/06/2010  . DIARRHEA, CHRONIC 04/06/2010  . ALCOHOL ABUSE, HX OF 04/06/2010  . COCAINE ABUSE, HX OF 04/06/2010  We are planning surgery for your upper extremity. The risk and benefits of surgery to include risk of bleeding, infection, anesthesia,  damage to normal structures and failure of the surgery to accomplish its intended goals of relieving symptoms and restoring function have been discussed in detail. With this in mind we plan to proceed. I have specifically  discussed with the patient the pre-and postoperative regime and the dos and don'ts and risk and benefits in great detail. Risk and benefits of surgery also include risk of dystrophy(CRPS), chronic nerve pain, failure of the healing process to go onto completion and other inherent risks of surgery The relavent the pathophysiology of the disease/injury process, as well as the alternatives for treatment and postoperative course of action has been discussed in great detail with the patient who desires to proceed.  We will do everything in our power to help you (the patient) restore function to the upper extremity. It is a pleasure to see this patient today. We have discussed with the patient the issues regarding their infection to the extremity. We will continue antibiotics and await culture results. Often times it will take 3-5 days for cultures to become final. During this time we will typically have the patient on intravenous antibiotics until we can find a parenteral route of antibiotic regime specific for the bacteria or organism isolated. We have discussed with the patient the need for daily irrigation and debridement as well as therapy to  the area. We have discussed with the patient the necessity of range of motion to the involved joints as discussed today. We have discussed with the patient the unpredictability of infections at times. We'll continue to work towards good pain control and restoration of function. The patient understands the need for meticulous wound care and the necessity of proper followup.  The possible complications of stiffness (loss of motion), resistant infection, possible deep bone infection, possible chronic pain issues, possible need for multiple surgeries and even amputation.  With this in mind the patient understands our goal is to eradicate the infection to quiesence. We will continue to work towards these goals.   BUCHANAN,BRIAN L, PA-C 02/28/2016, 3:03 PM   

## 2016-02-28 NOTE — ED Triage Notes (Signed)
Patient here with right hand little finger swelling and redness since Tuesday. Has been seen at an urgent care and [facility] ED for same and currently taking 2 antibiotics. This am increased redness to hand. No further drainage from wound. Denies fever

## 2016-02-28 NOTE — ED Provider Notes (Signed)
[facility] DEPT Provider Note   CSN: [id number] Arrival date & time: 02/28/16  0917     History   Chief Complaint Chief Complaint  Patient presents with  . finger swelling, redness    HPI Todd Choi is a 55 y.o. male.  55 year old Caucasian male witha past medical history sig for hep C and substance abuse presents to the ED today with right pinky finger pain and swelling. Patient states he burned his finger approximately 3 weeks ago and has been picking at the scab. Patient admits not taking care of the burn and states it has become infected. Patient was seen at urgent care approximate 6 days ago and started on the Bactrim. Patient states that the edema, erythema, and pain has increased since. He was then seen in the ED at [facility] 3 days ago where an I&D was performed with only small amount of purulent fluid expressed. The Keflex was added to patient's antibiotic regimen and given referral to hand. Patient states that the edema, erythema, ecchymosis, pain has continued increased despite pain medicine and antibiotics. He also states that he has increased swelling to his right hand along with erythema. He denies any further drainage from the wound. Patient denies any fever, chills, nausea, emesis. He has limited range of motion of the finger due to swelling and pain.       History reviewed. No pertinent past medical history.  Patient Active Problem List   Diagnosis Date Noted  . HEPATITIS C 04/06/2010  . SMOKER 04/06/2010  . DEPRESSION 04/06/2010  . GERD 04/06/2010  . DIARRHEA, CHRONIC 04/06/2010  . ALCOHOL ABUSE, HX OF 04/06/2010  . COCAINE ABUSE, HX OF 04/06/2010    History reviewed. No pertinent surgical history.     Home Medications    Prior to Admission medications   Medication Sig Start Date End Date Taking? Authorizing Provider  cephALEXin (KEFLEX) 500 MG capsule Take 1 capsule (500 mg total) by mouth 4 (four) times daily. 02/26/16  Yes Alexandra M  Law, PA-C  ibuprofen (ADVIL,MOTRIN) 800 MG tablet Take 800 mg by mouth 3 (three) times daily as needed for moderate pain.   Yes Historical Provider, MD  sulfamethoxazole-trimethoprim (BACTRIM DS,SEPTRA DS) 800-160 MG tablet Take 1 tablet by mouth 2 (two) times daily.   Yes Historical Provider, MD    Family History No family history on file.  Social History Social History  Substance Use Topics  . Smoking status: Heavy Tobacco Smoker  . Smokeless tobacco: Not on file  . Alcohol use No     Allergies   Patient has no known allergies.   Review of Systems Review of Systems  Constitutional: Negative for chills and fever.  Gastrointestinal: Negative for abdominal pain, diarrhea, nausea and vomiting.  Genitourinary: Negative for flank pain, frequency, hematuria and urgency.  Musculoskeletal: Positive for joint swelling and myalgias.  Skin: Positive for color change and wound.  Neurological: Negative for dizziness.  All other systems reviewed and are negative.    Physical Exam Updated Vital Signs BP 120/100   Pulse 80   Temp 98 F (36.7 C) (Oral)   Resp 18   Ht 6' (1.829 m)   Wt 85.7 kg   SpO2 99%   BMI 25.63 kg/m   Physical Exam  Constitutional: He appears well-developed and well-nourished. No distress.  HENT:  Head: Normocephalic and atraumatic.  Eyes: Conjunctivae are normal. Right eye exhibits no discharge. Left eye exhibits no discharge. No scleral icterus.  Neck: Normal range of motion.  Neck supple.  Cardiovascular: Normal rate, regular rhythm, normal heart sounds and intact distal pulses.   Pulses:      Radial pulses are 2+ on the right side, and 2+ on the left side.  Pulmonary/Chest: No respiratory distress.  Abdominal: Soft. Bowel sounds are normal. There is no tenderness. There is no rebound and no guarding.  Musculoskeletal: Normal range of motion.   Patient with circum flex swelling of the right pinky finger. There is sig amount of erythema and echymosis  consistent with cellulitic changes. The finger with sig induration and an area of fluctuance over the dorsal radial aspect of the DIP. No purulent discharge appreciated. He has no tenderness over the flexor sheath. He has limited ROM of the finger and is unable to flex or extend PIP and 5th metacarpal due to pain and swelling. Suspicious for septic joint. Mild edema over the palmar surface of the right hand with excoriation and mild erythema. No sign streaking noted. Patient with full ROM of the right wrist and 1-4 mc and dip joint.    Lymphadenopathy:    He has no cervical adenopathy.  Neurological: He is alert.  Skin: Capillary refill takes less than 2 seconds. No pallor.  Nursing note and vitals reviewed.        ED Treatments / Results  Labs (all labs ordered are listed, but only abnormal results are displayed) Labs Reviewed - No data to display  EKG  EKG Interpretation None       Radiology No results found.  Procedures Procedures (including critical care time)  Medications Ordered in ED Medications - No data to display   Initial Impression / Assessment and Plan / ED Course  I have reviewed the triage vital signs and the nursing notes.  Pertinent labs & imaging results that were available during my care of the patient were reviewed by me and considered in my medical decision making (see chart for details).  Clinical Course   Patient presents with worsening cellulitis of the right pinky finger and concern for septic arthritis given limited rom. Patient has failed outpatient abx including keflex and bactrim. IandD was unsuccessful. Consulted with Dr. Amedeo Plenty with hand surgery who agrees to come to ED to assess patient. No leukocytosis noted. Patient is afebrile and non toxic appearing. Dr. Amedeo Plenty perfomred I and D and joint wash in the ED and was concern for septic arthritis. Feels patient with need hospital admission and IV abx for 12 weeks. Patient was admitted to ortho  service. He is agreeable to the above plan.   Final Clinical Impressions(s) / ED Diagnoses   Final diagnoses:  Pyogenic arthritis of right hand, due to unspecified organism ([place])  Cellulitis of finger of right hand    New Prescriptions New Prescriptions   No medications on file     Doristine Devoid, PA-C 02/28/16 2306    Fredia Sorrow, MD 03/02/16 [phone]

## 2016-02-28 NOTE — ED Notes (Signed)
Pt back from x-ray.

## 2016-02-29 ENCOUNTER — Encounter (HOSPITAL_COMMUNITY): Payer: Self-pay | Admitting: *Deleted

## 2016-02-29 LAB — COMPREHENSIVE METABOLIC PANEL
ALBUMIN: 3.9 g/dL (ref 3.5–5.0)
ALT: 18 U/L (ref 17–63)
AST: 16 U/L (ref 15–41)
Alkaline Phosphatase: 49 U/L (ref 38–126)
Anion gap: 9 (ref 5–15)
BUN: 10 mg/dL (ref 6–20)
CHLORIDE: 102 mmol/L (ref 101–111)
CO2: 27 mmol/L (ref 22–32)
CREATININE: 0.91 mg/dL (ref 0.61–1.24)
Calcium: 9.3 mg/dL (ref 8.9–10.3)
GFR calc Af Amer: >60 mL/min (ref >60)
GLUCOSE: 128 mg/dL — AB (ref 65–99)
POTASSIUM: 3.9 mmol/L (ref 3.5–5.1)
Sodium: 138 mmol/L (ref 135–145)
Total Bilirubin: 0.4 mg/dL (ref 0.3–1.2)
Total Protein: 7.1 g/dL (ref 6.5–8.1)

## 2016-02-29 LAB — CBC WITH DIFFERENTIAL/PLATELET
BASOS ABS: 0 10*3/uL (ref 0.0–0.1)
BASOS PCT: 0 %
EOS PCT: 1 %
Eosinophils Absolute: 0.1 10*3/uL (ref 0.0–0.7)
HEMATOCRIT: 42.1 % (ref 39.0–52.0)
Hemoglobin: 14.5 g/dL (ref 13.0–17.0)
LYMPHS PCT: 18 %
Lymphs Abs: 2 10*3/uL (ref 0.7–4.0)
MCH: 30.3 pg (ref 26.0–34.0)
MCHC: 34.4 g/dL (ref 30.0–36.0)
MCV: 87.9 fL (ref 78.0–100.0)
MONO ABS: 1.2 10*3/uL — AB (ref 0.1–1.0)
Monocytes Relative: 11 %
NEUTROS ABS: 7.8 10*3/uL — AB (ref 1.7–7.7)
Neutrophils Relative %: 70 %
PLATELETS: 220 10*3/uL (ref 150–400)
RBC: 4.79 MIL/uL (ref 4.22–5.81)
RDW: 13.7 % (ref 11.5–15.5)
WBC: 11.2 10*3/uL — AB (ref 4.0–10.5)

## 2016-02-29 MED ORDER — INFLUENZA VAC SPLIT QUAD 0.5 ML IM SUSY
0.5000 mL | PREFILLED_SYRINGE | INTRAMUSCULAR | Status: AC
  Administered 2016-02-29: 0.5 mL via INTRAMUSCULAR
  Filled 2016-02-29: qty 0.5

## 2016-02-29 MED ORDER — NICOTINE 21 MG/24HR TD PT24
21.0000 mg | MEDICATED_PATCH | Freq: Every day | TRANSDERMAL | Status: DC
  Administered 2016-02-29 – 2016-03-03 (×4): 21 mg via TRANSDERMAL
  Filled 2016-02-29 (×4): qty 1

## 2016-02-29 MED ORDER — DEXTROSE 5 % IV SOLN
2.0000 g | INTRAVENOUS | Status: DC
  Administered 2016-02-29 – 2016-03-01 (×2): 2 g via INTRAVENOUS
  Filled 2016-02-29 (×2): qty 2

## 2016-02-29 NOTE — Evaluation (Signed)
Occupational Therapy Evaluation and Discharge Patient Details Name: Todd Choi MRN: [id number] DOB: February 06, 1961 Today's Date: 02/29/2016    History of Present Illness s/p I&D R small finger due to infectious distal IP joint with infectious tenosynovitis of extensor. PMH: polysubstance abuse, current smoker.   Clinical Impression   Pt educated in edema management with pt verbalizing understanding. Pt is performing ADL modified independently and mobilizing independently including managing his IV pole. Pt with shoulder immobilizer in room, but does not prefer to use.  No further OT needs.   Follow Up Recommendations  No OT follow up    Equipment Recommendations  None recommended by OT    Recommendations for Other Services       Precautions / Restrictions Precautions Precautions: None Restrictions Weight Bearing Restrictions: No Other Position/Activity Restrictions: has a sling in room, does not prefer to wear, adhered to NWB through R hand      Mobility Bed Mobility Overal bed mobility: Independent                Transfers Overall transfer level: Independent                    Balance                                            ADL Overall ADL's : Modified independent                                       General ADL Comments: Educated pt in compensatory strategies and in edema management. Provided pillows x 3 to elevate R hand.     Vision     Perception     Praxis      Pertinent Vitals/Pain Pain Assessment: Faces Faces Pain Scale: Hurts a little bit Pain Location: R small finger Pain Descriptors / Indicators: Sore Pain Intervention(s): Monitored during session     Hand Dominance Right   Extremity/Trunk Assessment Upper Extremity Assessment Upper Extremity Assessment: RUE deficits/detail RUE Deficits / Details: pt's R small finger and wrist immobilized RUE Coordination: decreased fine motor    Lower Extremity Assessment Lower Extremity Assessment: Overall WFL for tasks assessed       Communication Communication Communication: No difficulties   Cognition Arousal/Alertness: Awake/alert Behavior During Therapy: Anxious Overall Cognitive Status: Within Functional Limits for tasks assessed                     General Comments       Exercises       Shoulder Instructions      Home Living Family/patient expects to be discharged to:: Private residence Living Arrangements: Alone                                      Prior Functioning/Environment Level of Independence: Independent        Comments: pt works as a Architect Problem List: Pain;Impaired UE functional use;Increased edema   OT Treatment/Interventions:      OT Goals(Current goals can be found in the care plan section) Acute Rehab OT Goals Patient Stated Goal: to go home  OT Frequency:  Barriers to D/C:            Co-evaluation              End of Session    Activity Tolerance: Patient tolerated treatment well Patient left: in bed;with call bell/phone within reach   Time: 1010-1027 OT Time Calculation (min): 17 min Charges:  OT General Charges $OT Visit: 1 Procedure OT Evaluation $OT Eval Low Complexity: 1 Procedure G-Codes:    Malka So 02/29/2016, 10:33 AM  [phone]

## 2016-02-29 NOTE — Consult Note (Signed)
Todd Choi for Infectious Disease  Date of Admission:  02/28/2016  Date of Consult:  02/29/2016  Reason for Consult: hand infection Referring Physician: Dr. Amedeo Plenty  Impression/Recommendation  Right 5th finger DIP infectious tenosynovitis, unclear etiology.  This is likely direct bacterial seeding of skin flora. Most likely Staph or Strep. Cultures pending, may not grow out since he had received abx outpatient. Will await culture 48 hour and continue vanc+zosyn for now. He appears to be doing well. Could narrow to oral and finish 7-10 day course.    Thank you so much for this interesting consult,   [name]   Todd Choi is an 55 y.o. male.  HPI:  PMH ox hep C s/p Harvoni followed by [facility] ID, and substance abuse, who presented to the ED yesterday with right 5th finger pain, erythema, and swelling. 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 He can't recall whether he had a cut or burn recently. He noticed his right 5th finger DIP area swelling and redness last week. Was started on bactrim 6 days ago by Urgent care. He states it has been getting worse, went to [facility] ED 3 days ago, had I&D (only small purulent fluid expressed, no culture was sent). Keflex was added and was given hand surgery referral. His swelling and redness continued to worsen even after that. Had another I&D by Dr. Amedeo Plenty on 12/3 of right 5th DIP for infectious tenosynovitis of extensor apparatus. Has been on vanc+zosyn. Wound culture NGTD. We were consulted for antibiotic recommendations.   He is doing well. Having some pain but well controlled with pain regimen. No fevers. Tolerating abx ok.   History reviewed.  History reviewed. No pertinent surgical history.  No Known Allergies  Medications: I have reviewed the patient's current medications.  Abtx:  Anti-infectives    Start     Dose/Rate Route Frequency Ordered Stop   02/29/16 0230  vancomycin (VANCOCIN) IVPB  1000 mg/200 mL premix     1,000 mg 200 mL/hr over 60 Minutes Intravenous Every 8 hours 02/28/16 1724     02/29/16 0200  piperacillin-tazobactam (ZOSYN) IVPB 3.375 g     3.375 g 12.5 mL/hr over 240 Minutes Intravenous Every 8 hours 02/28/16 1724     02/28/16 1730  piperacillin-tazobactam (ZOSYN) IVPB 3.375 g     3.375 g 100 mL/hr over 30 Minutes Intravenous  Once 02/28/16 1724 02/28/16 1856   02/28/16 1730  vancomycin (VANCOCIN) 1,750 mg in sodium chloride 0.9 % 500 mL IVPB     1,750 mg 250 mL/hr over 120 Minutes Intravenous  Once 02/28/16 1724 02/28/16 2025      Total days of antibiotics: 1 vanc+zosyn.          Social History:  reports that he has been smoking.  He does not have any smokeless tobacco history on file. He reports that he uses drugs, including Cocaine. He reports that he does not drink alcohol.  No family history on file.  Review of Systems  Constitutional: Negative for chills and fever.  Eyes: Negative for blurred vision.  Cardiovascular: Negative for chest pain and palpitations.  Gastrointestinal: Negative for diarrhea, nausea and vomiting.  Skin: Negative for itching and rash.  Neurological: Negative for dizziness and headaches.     Blood pressure 107/70, pulse 76, temperature 97.8 F (36.6 C), temperature source Oral, resp. rate 19, height 6' (1.829 m), weight 189 lb (85.7 kg), SpO2 97 %. Physical Exam  Constitutional: He is oriented to person, place, and time. He appears well-developed and well-nourished. No distress.  HENT:  Head: Normocephalic and atraumatic.  Eyes: Conjunctivae and EOM are normal. Pupils are equal, round, and reactive to light. Right eye exhibits no discharge. Left eye exhibits no discharge.  Neck: Normal range of motion.  Cardiovascular: Normal rate and regular rhythm.  Exam reveals no gallop and no friction rub.   No murmur heard. Respiratory: Effort normal and breath sounds normal. No respiratory distress. He has no wheezes.  GI:  Soft. Bowel sounds are normal. He exhibits no distension. There is no tenderness.  Musculoskeletal: Normal range of motion.  Right 5th finger is covered with dressing. He is able to move it slightly. Has some pain with movement. I did not remove the dressing today. No ascending erythema towards the wrist or forearm or the rest of the hand.  Neurological: He is alert and oriented to person, place, and time. No cranial nerve deficit.  Skin: Skin is warm. He is not diaphoretic.  Psychiatric: He has a normal mood and affect.    Results for orders placed or performed during the hospital encounter of 02/28/16 (from the past 48 hour(s))  CBC with Differential     Status: Abnormal   Collection Time: 02/28/16 11:41 AM  Result Value Ref Range   WBC 10.9 (H) 4.0 - 10.5 K/uL   RBC 5.00 4.22 - 5.81 MIL/uL   Hemoglobin 15.1 13.0 - 17.0 g/dL   HCT 44.2 39.0 - 52.0 %   MCV 88.4 78.0 - 100.0 fL   MCH 30.2 26.0 - 34.0 pg   MCHC 34.2 30.0 - 36.0 g/dL   RDW 13.7 11.5 - 15.5 %   Platelets 231 150 - 400 K/uL   Neutrophils Relative % 63 %   Neutro Abs 6.9 1.7 - 7.7 K/uL   Lymphocytes Relative 26 %   Lymphs Abs 2.9 0.7 - 4.0 K/uL   Monocytes Relative 9 %   Monocytes Absolute 1.0 0.1 - 1.0 K/uL   Eosinophils Relative 2 %   Eosinophils Absolute 0.2 0.0 - 0.7 K/uL   Basophils Relative 0 %   Basophils Absolute 0.0 0.0 - 0.1 K/uL  Basic metabolic panel     Status: Abnormal   Collection Time: 02/28/16 11:41 AM  Result Value Ref Range   Sodium 137 135 - 145 mmol/L   Potassium 4.7 3.5 - 5.1 mmol/L   Chloride 106 101 - 111 mmol/L   CO2 24 22 - 32 mmol/L   Glucose, Bld 105 (H) 65 - 99 mg/dL   BUN 15 6 - 20 mg/dL   Creatinine, Ser 0.84 0.61 - 1.24 mg/dL   Calcium 9.5 8.9 - 10.3 mg/dL   GFR calc non Af Amer >60 >60 mL/min   GFR calc Af Amer >60 >60 mL/min    Comment: (NOTE) The eGFR has been calculated using the CKD EPI equation. This calculation has not been validated in all clinical situations. eGFR's  persistently <60 mL/min signify possible Chronic Kidney Disease.    Anion gap 7 5 - 15  Aerobic Culture (superficial specimen)     Status: None (Preliminary result)   Collection Time: 02/28/16  4:30 PM  Result Value Ref Range   Specimen Description WOUND RIGHT FINGER    Special Requests NONE    Gram Stain NO WBC SEEN NO ORGANISMS SEEN     Culture PENDING    Report Status PENDING   Basic metabolic panel  Status: Abnormal   Collection Time: 02/28/16  6:01 PM  Result Value Ref Range   Sodium 135 135 - 145 mmol/L   Potassium 4.2 3.5 - 5.1 mmol/L   Chloride 102 101 - 111 mmol/L   CO2 22 22 - 32 mmol/L   Glucose, Bld 106 (H) 65 - 99 mg/dL   BUN 13 6 - 20 mg/dL   Creatinine, Ser 0.82 0.61 - 1.24 mg/dL   Calcium 9.5 8.9 - 10.3 mg/dL   GFR calc non Af Amer >60 >60 mL/min   GFR calc Af Amer >60 >60 mL/min    Comment: (NOTE) The eGFR has been calculated using the CKD EPI equation. This calculation has not been validated in all clinical situations. eGFR's persistently <60 mL/min signify possible Chronic Kidney Disease.    Anion gap 11 5 - 15  CBC WITH DIFFERENTIAL     Status: Abnormal   Collection Time: 02/29/16  3:42 AM  Result Value Ref Range   WBC 11.2 (H) 4.0 - 10.5 K/uL   RBC 4.79 4.22 - 5.81 MIL/uL   Hemoglobin 14.5 13.0 - 17.0 g/dL   HCT 42.1 39.0 - 52.0 %   MCV 87.9 78.0 - 100.0 fL   MCH 30.3 26.0 - 34.0 pg   MCHC 34.4 30.0 - 36.0 g/dL   RDW 13.7 11.5 - 15.5 %   Platelets 220 150 - 400 K/uL   Neutrophils Relative % 70 %   Neutro Abs 7.8 (H) 1.7 - 7.7 K/uL   Lymphocytes Relative 18 %   Lymphs Abs 2.0 0.7 - 4.0 K/uL   Monocytes Relative 11 %   Monocytes Absolute 1.2 (H) 0.1 - 1.0 K/uL   Eosinophils Relative 1 %   Eosinophils Absolute 0.1 0.0 - 0.7 K/uL   Basophils Relative 0 %   Basophils Absolute 0.0 0.0 - 0.1 K/uL      Component Value Date/Time   SDES WOUND RIGHT FINGER 02/28/2016 1630   SPECREQUEST NONE 02/28/2016 1630   CULT PENDING 02/28/2016 1630    REPTSTATUS PENDING 02/28/2016 1630   Dg Hand Complete Right  Result Date: 02/28/2016 CLINICAL DATA:  Pain to RIGHT little finger. Swollen and red. Burn injury. EXAM: RIGHT HAND - COMPLETE 3+ VIEW COMPARISON:  Fifth digit x-ray 02/25/2016. FINDINGS: There is no evidence of fracture or dislocation. Marked soft tissue swelling is noted particularly of the fifth digit. 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 IMPRESSION: Marked soft tissue swelling.  No definite osseous reaction. Electronically Signed   By: Staci Righter M.D.   On: 02/28/2016 12:07   Recent Results (from the past 240 hour(s))  Aerobic Culture (superficial specimen)     Status: None (Preliminary result)   Collection Time: 02/28/16  4:30 PM  Result Value Ref Range Status   Specimen Description WOUND RIGHT FINGER  Final   Special Requests NONE  Final   Gram Stain NO WBC SEEN NO ORGANISMS SEEN   Final   Culture PENDING  Incomplete   Report Status PENDING  Incomplete      02/29/2016, 10:26 AM     LOS: 1 day    Records and images were personally reviewed where available.

## 2016-02-29 NOTE — Progress Notes (Signed)
PT Cancellation and Discharge Note  Patient Details Name: Todd Choi MRN: [id number] DOB: 1960/11/15   Cancelled Treatment:    Reason Eval/Treat Not Completed: PT screened, no needs identified, will sign off   Roney Marion, [place] Pager [phone] Office [phone]    Colletta [place] 02/29/2016, 11:33 AM

## 2016-02-29 NOTE — Progress Notes (Addendum)
Patient ID: Todd Choi, male   DOB: 04/11/1960, 55 y.o.   MRN: [id number] Patient is seen postop day 1.  Patient is alert and oriented in no acute distress.  Bandages clean dry and intact.  He is having no problems with his IV antibiotics.  He has no other complaints.  He requests a nicotine patch.  The patient is alert and oriented in no acute distress. The patient complains of pain in the affected upper extremity.  The patient is noted to have a normal HEENT exam. Lung fields show equal chest expansion and no shortness of breath. Abdomen exam is nontender without distention. Lower extremity examination does not show any fracture dislocation or blood clot symptoms. Pelvis is stable and the neck and back are stable and nontender.    Assessment: Status post I&D  infectious distal interphalangeal joint with infectious tenosynovitis of the extensor apparatus  Plan: We will plan for continued IV antibiotics and await cultures. I discussed with patient I will order nicotine patch. We will manage his pain.  Tomorrow we will change his dressing and placed him with a regime of lavage to the area.  He has a high propensity towards infectious arthritis. He already has some pre-existing arthritis I do feel that in his future he will have worsening arthritis as we discussed at bedside yesterday and today at great length. We have discussed these issues in detailed his notes.   I would consider treating him presumptively for osteomyelitis given the fact that he presented late such a dramatic infection. We'll discuss with infectious disease.  Arletha Marschke M.D.

## 2016-03-01 ENCOUNTER — Encounter (HOSPITAL_COMMUNITY): Payer: Self-pay | Admitting: General Practice

## 2016-03-01 DIAGNOSIS — B9561 Methicillin susceptible Staphylococcus aureus infection as the cause of diseases classified elsewhere: Secondary | ICD-10-CM

## 2016-03-01 DIAGNOSIS — M00041 Staphylococcal arthritis, right hand: Secondary | ICD-10-CM

## 2016-03-01 LAB — CBC WITH DIFFERENTIAL/PLATELET
Basophils Absolute: 0 10*3/uL (ref 0.0–0.1)
Basophils Relative: 0 %
EOS ABS: 0.1 10*3/uL (ref 0.0–0.7)
Eosinophils Relative: 1 %
HEMATOCRIT: 41.5 % (ref 39.0–52.0)
HEMOGLOBIN: 14.1 g/dL (ref 13.0–17.0)
LYMPHS ABS: 1.2 10*3/uL (ref 0.7–4.0)
Lymphocytes Relative: 14 %
MCH: 29.7 pg (ref 26.0–34.0)
MCHC: 34 g/dL (ref 30.0–36.0)
MCV: 87.6 fL (ref 78.0–100.0)
MONO ABS: 0.7 10*3/uL (ref 0.1–1.0)
MONOS PCT: 8 %
NEUTROS PCT: 77 %
Neutro Abs: 6.5 10*3/uL (ref 1.7–7.7)
Platelets: 217 10*3/uL (ref 150–400)
RBC: 4.74 MIL/uL (ref 4.22–5.81)
RDW: 13.4 % (ref 11.5–15.5)
WBC: 8.4 10*3/uL (ref 4.0–10.5)

## 2016-03-01 MED ORDER — CEFAZOLIN SODIUM-DEXTROSE 2-4 GM/100ML-% IV SOLN
2.0000 g | Freq: Three times a day (TID) | INTRAVENOUS | Status: DC
  Filled 2016-03-01 (×3): qty 100

## 2016-03-01 MED ORDER — CEFAZOLIN SODIUM-DEXTROSE 2-4 GM/100ML-% IV SOLN
2.0000 g | Freq: Three times a day (TID) | INTRAVENOUS | Status: DC
  Administered 2016-03-02 – 2016-03-03 (×3): 2 g via INTRAVENOUS
  Filled 2016-03-01 (×5): qty 100

## 2016-03-01 MED ORDER — BACITRACIN-NEOMYCIN-POLYMYXIN 400-5-5000 EX OINT
TOPICAL_OINTMENT | CUTANEOUS | Status: AC
  Filled 2016-03-01: qty 3

## 2016-03-01 NOTE — Progress Notes (Signed)
Pt's IV infiltrated on day shift. IV team consulted to place new IV, however when IV team came to the Pt's room, Pt refused. Had a discussion with day shift RN and pt together that PA specifically stated he wanted the pt to have IV access overnight (before they inserted PICC). He still refused to have new IV placed.

## 2016-03-01 NOTE — Progress Notes (Signed)
INFECTIOUS DISEASE PROGRESS NOTE  ID: Todd Choi is a 55 y.o. male with  Active Problems:   Septic arthritis of interphalangeal joint of finger (HCC)  Subjective: Continues to have pain on the right 5th finger. No fevers overnight. Wbc trending down.   Abtx:  Anti-infectives    Start     Dose/Rate Route Frequency Ordered Stop   02/29/16 1300  cefTRIAXone (ROCEPHIN) 2 g in dextrose 5 % 50 mL IVPB     2 g 100 mL/hr over 30 Minutes Intravenous Every 24 hours 02/29/16 1131     02/29/16 0230  vancomycin (VANCOCIN) IVPB 1000 mg/200 mL premix     1,000 mg 200 mL/hr over 60 Minutes Intravenous Every 8 hours 02/28/16 1724     02/29/16 0200  piperacillin-tazobactam (ZOSYN) IVPB 3.375 g  Status:  Discontinued     3.375 g 12.5 mL/hr over 240 Minutes Intravenous Every 8 hours 02/28/16 1724 02/29/16 1131   02/28/16 1730  piperacillin-tazobactam (ZOSYN) IVPB 3.375 g     3.375 g 100 mL/hr over 30 Minutes Intravenous  Once 02/28/16 1724 02/28/16 1856   02/28/16 1730  vancomycin (VANCOCIN) 1,750 mg in sodium chloride 0.9 % 500 mL IVPB     1,750 mg 250 mL/hr over 120 Minutes Intravenous  Once 02/28/16 1724 02/28/16 2025      Medications: I have reviewed the patient's current medications.  Objective: Vital signs in last 24 hours: Temp:  [97.1 F (36.2 C)-98.1 F (36.7 C)] 97.7 F (36.5 C) (12/05 0410) Pulse Rate:  [67-88] 88 (12/05 0410) Resp:  [16] 16 (12/05 0410) BP: (95-122)/(62-79) 110/74 (12/05 0410) SpO2:  [97 %-99 %] 97 % (12/05 0410)   Physical Exam  Constitutional: Todd Choi is oriented to person, place, and time. Todd Choi appears well-developed and well-nourished. No distress.  HENT:  Head: Normocephalic and atraumatic.  Cardiovascular: Normal rate and regular rhythm.  Exam reveals no gallop and no friction rub.   No murmur heard. Respiratory: Effort normal and breath sounds normal. No respiratory distress. Todd Choi has no wheezes.  GI: Soft. Bowel sounds are normal. Todd Choi exhibits no  distension. There is no tenderness.  Musculoskeletal: Normal range of motion.  Right 5th finger is covered with dressing. Todd Choi is able to move it slightly. Has some pain with movement. I did not remove the dressing today. No ascending erythema towards the wrist or forearm or the rest of the hand.  Neurological: Todd Choi is alert and oriented to person, place, and time. No cranial nerve deficit.  Skin: Skin is warm. Todd Choi is not diaphoretic.  Psychiatric: Todd Choi has a normal mood and affect.    Lab Results  Recent Labs  02/28/16 1801 02/29/16 0342 02/29/16 1443 03/01/16 0305  WBC  --  11.2*  --  8.4  HGB  --  14.5  --  14.1  HCT  --  42.1  --  41.5  NA 135  --  138  --   K 4.2  --  3.9  --   CL 102  --  102  --   CO2 22  --  27  --   BUN 13  --  10  --   CREATININE 0.82  --  0.91  --    Liver Panel  Recent Labs  02/29/16 1443  PROT 7.1  ALBUMIN 3.9  AST 16  ALT 18  ALKPHOS 49  BILITOT 0.4   Sedimentation Rate No results for input(s): ESRSEDRATE in the last 72 hours. C-Reactive Protein No  results for input(s): CRP in the last 72 hours.  Microbiology: Recent Results (from the past 240 hour(s))  Aerobic Culture (superficial specimen)     Status: None (Preliminary result)   Collection Time: 02/28/16  4:30 PM  Result Value Ref Range Status   Specimen Description WOUND RIGHT FINGER  Final   Special Requests NONE  Final   Gram Stain NO WBC SEEN NO ORGANISMS SEEN   Final   Culture FEW STAPHYLOCOCCUS AUREUS  Final   Report Status PENDING  Incomplete   Organism ID, Bacteria STAPHYLOCOCCUS AUREUS  Final      Susceptibility   Staphylococcus aureus - MIC*    CIPROFLOXACIN <=0.5 SENSITIVE Sensitive     ERYTHROMYCIN <=0.25 SENSITIVE Sensitive     GENTAMICIN <=0.5 SENSITIVE Sensitive     OXACILLIN <=0.25 SENSITIVE Sensitive     TETRACYCLINE <=1 SENSITIVE Sensitive     VANCOMYCIN <=0.5 SENSITIVE Sensitive     TRIMETH/SULFA <=10 SENSITIVE Sensitive     CLINDAMYCIN <=0.25 SENSITIVE  Sensitive     RIFAMPIN <=0.5 SENSITIVE Sensitive     Inducible Clindamycin NEGATIVE Sensitive     * FEW STAPHYLOCOCCUS AUREUS    Studies/Results: Dg Hand Complete Right  Result Date: 02/28/2016 CLINICAL DATA:  Pain to RIGHT little finger. Swollen and red. Burn injury. EXAM: RIGHT HAND - COMPLETE 3+ VIEW COMPARISON:  Fifth digit x-ray 02/25/2016. FINDINGS: There is no evidence of fracture or dislocation. Marked soft tissue swelling is noted particularly of the fifth digit. 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 IMPRESSION: Marked soft tissue swelling.  No definite osseous reaction. Electronically Signed   By: Staci Righter M.D.   On: 02/28/2016 12:07     Assessment/Plan:  MSSA causing Right 5th finger DIP infectious tenosynovitis, unclear etiology for infection.  Vanc/zosyn 12/03 Vanc/ceftx 12/04 Will change to Ancef and do 4 weeks course with PICC line as culture showing pan sensitive staph aurueus.  Check HIV and Hepatitis panel (hx of cocaine use, denies IVDA. Contracts Todd Choi will not manipulate pic line.   Follow up at the ID clinic 3-4 weeks.          Todd Choi, Todd Choi  03/01/2016, 10:47 AM  LOS: 2 days    Diagnosis: MSSA septic arthritis  Culture Result: MSSA  No Known Allergies  Discharge antibiotics: Ancef 2 g IVPB q8 h Duration: 28 days End Date: 03-27-16  [facility] Care Per Protocol:  Labs weekly while on IV antibiotics: x__ CBC with differential __ BMP _x_ CMP _x_ CRP _x_ ESR __ Vancomycin trough  _x_ Please pull PIC at completion of IV antibiotics __ Please leave PIC in place until doctor has seen patient or been notified  Fax weekly labs to [phone]  Clinic Follow Up Appt: Todd Choi 4-5 weeks.

## 2016-03-01 NOTE — Progress Notes (Signed)
Subjective:    Patient reports pain as currently controlled. He has not taking any opioid medications since 7:30 AM this morning. He denies nausea, vomiting, fever or chills.  Objective: Vital signs in last 24 hours: Temp:  [97.7 F (36.5 C)-98.1 F (36.7 C)] 97.9 F (36.6 C) (12/05 1713) Pulse Rate:  [80-88] 83 (12/05 1713) Resp:  [16-18] 18 (12/05 1713) BP: (104-122)/(68-79) 104/68 (12/05 1713) SpO2:  [95 %-99 %] 95 % (12/05 1713)  Intake/Output from previous day: 12/04 0701 - 12/05 0700 In: 690 [P.O.:490; IV Piggyback:200] Out: -  Intake/Output this shift: Total I/O In: 730 [P.O.:480; IV Piggyback:250] Out: -    Recent Labs  02/28/16 1141 02/29/16 0342 03/01/16 0305  HGB 15.1 14.5 14.1    Recent Labs  02/29/16 0342 03/01/16 0305  WBC 11.2* 8.4  RBC 4.79 4.74  HCT 42.1 41.5  PLT 220 217    Recent Labs  02/28/16 1801 02/29/16 1443  NA 135 138  K 4.2 3.9  CL 102 102  CO2 22 27  BUN 13 10  CREATININE 0.82 0.91  GLUCOSE 106* 128*  CALCIUM 9.5 9.3   Results for orders placed or performed during the hospital encounter of 02/28/16  Aerobic Culture (superficial specimen)     Status: None (Preliminary result)   Collection Time: 02/28/16  4:30 PM  Result Value Ref Range Status   Specimen Description WOUND RIGHT FINGER  Final   Special Requests NONE  Final   Gram Stain NO WBC SEEN NO ORGANISMS SEEN   Final   Culture FEW STAPHYLOCOCCUS AUREUS  Final   Report Status PENDING  Incomplete   Organism ID, Bacteria STAPHYLOCOCCUS AUREUS  Final      Susceptibility   Staphylococcus aureus - MIC*    CIPROFLOXACIN <=0.5 SENSITIVE Sensitive     ERYTHROMYCIN <=0.25 SENSITIVE Sensitive     GENTAMICIN <=0.5 SENSITIVE Sensitive     OXACILLIN <=0.25 SENSITIVE Sensitive     TETRACYCLINE <=1 SENSITIVE Sensitive     VANCOMYCIN <=0.5 SENSITIVE Sensitive     TRIMETH/SULFA <=10 SENSITIVE Sensitive     CLINDAMYCIN <=0.25 SENSITIVE Sensitive     RIFAMPIN <=0.5 SENSITIVE  Sensitive     Inducible Clindamycin NEGATIVE Sensitive     * FEW STAPHYLOCOCCUS AUREUS     Physical examination: The patient is alert and oriented in no acute distress. The patient complains of pain in the affected upper extremity.  The patient is noted to have a normal HEENT exam. Lung fields show equal chest expansion and no shortness of breath. Abdomen exam is nontender without distention. Lower extremity examination does not show any fracture dislocation or blood clot symptoms. Pelvis is stable and the neck and back are stable and nontender. Dressings are removed about the right small finger. He hasn't had significant improvement in the ascending cellulitis and improvement in the overall edema of the hand. It remains hyperemic about the distal phalanx. Drain is pulled without difficulty he does have a scant amount of milky discharge present, his sensation refill are intact. His range of motion is limited secondary to pain however he is improving in regards to passive range of motion of the DIP compared to his preoperative evaluation.  Assessment/Plan:    #1 septic DIP joint right small finger, cultures indicative of Staphylococcus aureus Patient Active Problem List   Diagnosis Date Noted  . Septic arthritis of interphalangeal joint of finger ([place]) 02/28/2016  . HEPATITIS C 04/06/2010  . SMOKER 04/06/2010  . DEPRESSION 04/06/2010  .  GERD 04/06/2010  . DIARRHEA, CHRONIC 04/06/2010  . ALCOHOL ABUSE, HX OF 04/06/2010  . COCAINE ABUSE, HX OF 04/06/2010   We have discussed with the patient recommendations for repeat irrigation and excisional debridement at bedside. Thus after obtaining verbal consent have performed seasonal debridement of skin, subcutaneous tissue and necrotic improving necrotic tissue about the wounds. Each wound site about the ulnar and radial aspect of the dorsal DIP underwent excisional debridement. Wounds were approximated 0.5 cm in nature. Following excisional  debridement of the wounds copious irrigation was implemented through and through the wounds utilizing a 16-gauge catheter sheath, approximately 1 L of saline was implemented into the wounds and into the DIP joint. The patient tolerated this well, wet wicking's were then applied in each wound 2 following this Adaptic with triple antibiotic ointment was applied to the finger and a soft wrap. We have discussed with the patient recommendations for 4 weeks of IV Ancef per infectious disease. We will have a PICC line placed tomorrow for these purposes. We will need to recheck his wound tomorrow tentatively look towards discharge later in the week pending his wound conditions. All questions were encouraged and answered.  Rabecca Birge L 03/01/2016, 6:58 PM

## 2016-03-01 NOTE — Op Note (Signed)
NAME:  Todd Choi, Todd Choi NO.:  [id number]  MEDICAL RECORD NO.:  [id number]  LOCATION:                                 FACILITY:  PHYSICIAN:  Satira Anis. Analeigh Aries, M.D.DATE OF BIRTH:  September 24, 1960  DATE OF PROCEDURE:02/28/2016 DATE OF DISCHARGE:TBD                              OPERATIVE REPORT   PREOPERATIVE DIAGNOSIS:  Right small finger infection with septic joint and ______deep____ abscess formation.  POSTOPERATIVE DIAGNOSIS:  Right small finger infection with septic joint and _____deep_____ abscess formation.  PROCEDURE: 1. Intermetacarpal/nerve block, right hand and upper extremity. 2. Extensor tendon tenolysis, tenosynovectomy, right small finger,     extensive in nature. 3. Arthrotomy/synovectomy, right small finger distal interphalangeal     joint. 4. Irrigation and debridement of deep abscess, right interphalangeal     joint.  SURGEON:  Satira Anis. Amedeo Plenty, M.D.  ASSISTANT:  Avelina Laine, PA-C.  COMPLICATIONS:  None.  ANESTHESIA:  Intermetacarpal/nerve block to the hand.  ESTIMATED BLOOD LOSS:  Minimal.  INDICATIONS FOR THE PROCEDURE:  This patient presents with a very infected finger, this has been languishing for quite sometime based upon the review from the patient, my exam, and the chart review.  He unfortunately now has a situation in his upper extremity where he has a very complicated infected wound with an infected joint.  I have reviewed this with him at length and the findings.  I have discussed these issues with him at length and the relevant issues, concerns, and other aspects of his care.  I have discussed with him that certainly his examination is the one, which is concerning.  The examination certainly reveals degenerative changes about the finger joint pre-existing as well as what appears to now be a significant infectious process.  I have discussed with the patient risks and benefits of surgery, Do's and don'ts, timeframe,  duration of recovery, etc.  He was brought to the procedural suite and underwent smooth induction of intermetacarpal/nerve block.  Once this was complete, the patient then underwent a very careful and cautious approach to the extremity with Betadine scrub and paint followed by tourniquet application, and time- out being called.  At this time, I made a midlateral incision ulnarly, dissection was carried down, and a large amount of abscess was noted.  This was cultured for aerobic and anaerobic cultures following culture being taken, the patient had a counter incision made radially.  I placed a drain through and through.  At this time, I then performed extensor tendon tenolysis, tenosynovectomy above and below the tendon.  The patient had necrotic tissue and a large amount of abnormality.  The patient tolerated this well.  There were no complicating features.  Following this, we then discussed with the patient our findings and proceeded with a DIP arthrotomy/synovectomy.  Unfortunately, the DIP joint has a large amount of purulence.  I feel this is going to negatively affect him into the future and he will develop traumatic/infectious arthritis.  The patient had the joint thoroughly examined and the joint underwent an arthrotomy/synovectomy.  Following arthrotomy and synovectomy of the DIP joint, 3 L of saline were then placed to the finger with tourniquet deflated.  Following this, drain was secured  from medial to lateral.  The lateral wound was closed with 1 chromic suture and allowed an opening for drainage.  Given the variable prognosis, I do feel that he will have worsening infectious arthritis due to chondrolysis from the infection.  Once again, we performed I and D of deep abscess about the small finger, extensor tendon tenolysis, tenosynovectomy, and DIP arthrotomy/synovectomy.  The patient tolerated this reasonably well under block anesthetic.  Cultures were taken.  The  patient will be admitted for IV antibiotics, and we will await cultures.  I would consider long-term antibiotic treatment given his findings and I have discussed with him the relevant issues, do's and don'ts.  Should any problems occur, he is going to notify us, otherwise we will look forward to seeing him back in the office once the hospitalization is complete. I have discussed with him possible issues of long-term antibiotics and even PICC line.  My thoughts were that this has languished for so long that he is going to have a very difficult time ridding himself of the infection and will likely have infectious arthritis if not a possibility of osteomyelitis. Nevertheless, we are going to try and do everything we can to give him an upper extremity that is functional.  Do's and don'ts have been discussed and all questions have been encouraged and answered.     Satira Anis. Amedeo Plenty, M.D.   ______________________________ Satira Anis. Amedeo Plenty, M.D.    [facility]  D:  02/28/2016  T:  02/29/2016  Job:  [id number]

## 2016-03-02 LAB — CBC WITH DIFFERENTIAL/PLATELET
Basophils Absolute: 0 10*3/uL (ref 0.0–0.1)
Basophils Relative: 0 %
EOS ABS: 0.2 10*3/uL (ref 0.0–0.7)
Eosinophils Relative: 3 %
HEMATOCRIT: 42.6 % (ref 39.0–52.0)
HEMOGLOBIN: 14.4 g/dL (ref 13.0–17.0)
LYMPHS ABS: 2.3 10*3/uL (ref 0.7–4.0)
LYMPHS PCT: 30 %
MCH: 29.9 pg (ref 26.0–34.0)
MCHC: 33.8 g/dL (ref 30.0–36.0)
MCV: 88.4 fL (ref 78.0–100.0)
MONOS PCT: 16 %
Monocytes Absolute: 1.3 10*3/uL — ABNORMAL HIGH (ref 0.1–1.0)
NEUTROS ABS: 3.8 10*3/uL (ref 1.7–7.7)
NEUTROS PCT: 51 %
Platelets: 215 10*3/uL (ref 150–400)
RBC: 4.82 MIL/uL (ref 4.22–5.81)
RDW: 13.7 % (ref 11.5–15.5)
WBC: 7.6 10*3/uL (ref 4.0–10.5)

## 2016-03-02 LAB — HEPATITIS PANEL, ACUTE
HCV Ab: >11 s/co ratio — ABNORMAL HIGH (ref 0.0–0.9)
HEP A IGM: NEGATIVE
Hep B C IgM: NEGATIVE
Hepatitis B Surface Ag: NEGATIVE

## 2016-03-02 LAB — AEROBIC CULTURE  (SUPERFICIAL SPECIMEN): GRAM STAIN: NONE SEEN

## 2016-03-02 LAB — AEROBIC CULTURE W GRAM STAIN (SUPERFICIAL SPECIMEN)

## 2016-03-02 LAB — HIV ANTIBODY (ROUTINE TESTING W REFLEX): HIV Screen 4th Generation wRfx: NONREACTIVE

## 2016-03-02 MED ORDER — CEFAZOLIN SODIUM-DEXTROSE 2-4 GM/100ML-% IV SOLN
2.0000 g | Freq: Three times a day (TID) | INTRAVENOUS | 99 refills | Status: AC
Start: 2016-03-02 — End: 2016-03-27

## 2016-03-02 MED ORDER — SODIUM CHLORIDE 0.9% FLUSH: 10.0000 mL | INTRAVENOUS | Status: DC | PRN

## 2016-03-02 MED ORDER — BACITRACIN-NEOMYCIN-POLYMYXIN 400-5-5000 EX OINT
TOPICAL_OINTMENT | CUTANEOUS | Status: AC
  Filled 2016-03-02: qty 1

## 2016-03-02 NOTE — Progress Notes (Signed)
Called IV team to see when PICC line will be inserted.  Told me should be by lunch.  Informed them he has no IV access and IV antibiotic due at noon.

## 2016-03-02 NOTE — Progress Notes (Signed)
INFECTIOUS DISEASE PROGRESS NOTE  ID: Todd Choi is a 55 y.o. male with  Active Problems:   Septic arthritis of interphalangeal joint of finger ([place])  Subjective: Without complaints.   Abtx:  Anti-infectives    Start     Dose/Rate Route Frequency Ordered Stop   03/02/16 1200  ceFAZolin (ANCEF) IVPB 2g/100 mL premix     2 g 200 mL/hr over 30 Minutes Intravenous Every 8 hours 03/01/16 1444     03/01/16 1445  ceFAZolin (ANCEF) IVPB 2g/100 mL premix  Status:  Discontinued     2 g 200 mL/hr over 30 Minutes Intravenous Every 8 hours 03/01/16 1444 03/01/16 1444   02/29/16 1300  cefTRIAXone (ROCEPHIN) 2 g in dextrose 5 % 50 mL IVPB  Status:  Discontinued     2 g 100 mL/hr over 30 Minutes Intravenous Every 24 hours 02/29/16 1131 03/01/16 1444   02/29/16 0230  vancomycin (VANCOCIN) IVPB 1000 mg/200 mL premix  Status:  Discontinued     1,000 mg 200 mL/hr over 60 Minutes Intravenous Every 8 hours 02/28/16 1724 03/01/16 1444   02/29/16 0200  piperacillin-tazobactam (ZOSYN) IVPB 3.375 g  Status:  Discontinued     3.375 g 12.5 mL/hr over 240 Minutes Intravenous Every 8 hours 02/28/16 1724 02/29/16 1131   02/28/16 1730  piperacillin-tazobactam (ZOSYN) IVPB 3.375 g     3.375 g 100 mL/hr over 30 Minutes Intravenous  Once 02/28/16 1724 02/28/16 1856   02/28/16 1730  vancomycin (VANCOCIN) 1,750 mg in sodium chloride 0.9 % 500 mL IVPB     1,750 mg 250 mL/hr over 120 Minutes Intravenous  Once 02/28/16 1724 02/28/16 2025      Medications:  Scheduled: .  ceFAZolin (ANCEF) IV  2 g Intravenous Q8H  . multivitamin with minerals  1 tablet Oral Daily  . neomycin-bacitracin-polymyxin      . nicotine  21 mg Transdermal Daily  . senna  1 tablet Oral BID  . vitamin C  1,000 mg Oral Daily    Objective: Vital signs in last 24 hours: Temp:  [97.9 F (36.6 C)-98.3 F (36.8 C)] 98.3 F (36.8 C) (12/06 1447) Pulse Rate:  [69-83] 69 (12/06 1447) Resp:  [16-18] 18 (12/06 1447) BP:  (104-119)/(68-80) 117/78 (12/06 1447) SpO2:  [95 %-100 %] 100 % (12/06 1447)   General appearance: alert, cooperative and no distress Incision/Wound: R hand wrapped.   Lab Results  Recent Labs  02/28/16 1801  02/29/16 1443 03/01/16 0305 03/02/16 0319  WBC  --   < >  --  8.4 7.6  HGB  --   < >  --  14.1 14.4  HCT  --   < >  --  41.5 42.6  NA 135  --  138  --   --   K 4.2  --  3.9  --   --   CL 102  --  102  --   --   CO2 22  --  27  --   --   BUN 13  --  10  --   --   CREATININE 0.82  --  0.91  --   --   < > = values in this interval not displayed. Liver Panel  Recent Labs  02/29/16 1443  PROT 7.1  ALBUMIN 3.9  AST 16  ALT 18  ALKPHOS 49  BILITOT 0.4   Sedimentation Rate No results for input(s): ESRSEDRATE in the last 72 hours. C-Reactive Protein No results for input(s):  CRP in the last 72 hours.  Microbiology: Recent Results (from the past 240 hour(s))  Aerobic Culture (superficial specimen)     Status: None   Collection Time: 02/28/16  4:30 PM  Result Value Ref Range Status   Specimen Description WOUND RIGHT FINGER  Final   Special Requests NONE  Final   Gram Stain NO WBC SEEN NO ORGANISMS SEEN   Final   Culture FEW STAPHYLOCOCCUS AUREUS  Final   Report Status 03/02/2016 FINAL  Final   Organism ID, Bacteria STAPHYLOCOCCUS AUREUS  Final      Susceptibility   Staphylococcus aureus - MIC*    CIPROFLOXACIN <=0.5 SENSITIVE Sensitive     ERYTHROMYCIN <=0.25 SENSITIVE Sensitive     GENTAMICIN <=0.5 SENSITIVE Sensitive     OXACILLIN <=0.25 SENSITIVE Sensitive     TETRACYCLINE <=1 SENSITIVE Sensitive     VANCOMYCIN <=0.5 SENSITIVE Sensitive     TRIMETH/SULFA <=10 SENSITIVE Sensitive     CLINDAMYCIN <=0.25 SENSITIVE Sensitive     RIFAMPIN <=0.5 SENSITIVE Sensitive     Inducible Clindamycin NEGATIVE Sensitive     * FEW STAPHYLOCOCCUS AUREUS    Studies/Results: No results found.   Assessment/Plan: MSSA Septic Arthritis  Ancef 2 g IVPB q8  h Duration: 28 days End Date: 03-27-16  Available as needed. He will f/u in ID clinic in 1 month         [place] (pager) [phone] [email or web address] 03/02/2016, 3:32 PM  LOS: 3 days

## 2016-03-02 NOTE — Progress Notes (Signed)
Subjective:    Patient reports pain as controlled. He is having no current difficulties. His IV did infiltrate last night. Currently, he is awaiting PICC line placement and thus we will hold off on restarting the IV. His next dose of antibiotics or do at noon today. Overall, he is in good spirits and is having no difficulties. He is very eager for discharge.  Objective: Vital signs in last 24 hours: Temp:  [97.9 F (36.6 C)-98.2 F (36.8 C)] 97.9 F (36.6 C) (12/06 0410) Pulse Rate:  [79-83] 79 (12/06 0410) Resp:  [16-18] 16 (12/06 0410) BP: (104-119)/(68-80) 109/75 (12/06 0410) SpO2:  [95 %-98 %] 96 % (12/06 0410)  Intake/Output from previous day: 12/05 0701 - 12/06 0700 In: 730 [P.O.:480; IV Piggyback:250] Out: -  Intake/Output this shift: No intake/output data recorded.   Recent Labs  02/28/16 1141 02/29/16 0342 03/01/16 0305 03/02/16 0319  HGB 15.1 14.5 14.1 14.4    Recent Labs  03/01/16 0305 03/02/16 0319  WBC 8.4 7.6  RBC 4.74 4.82  HCT 41.5 42.6  PLT 217 215    Recent Labs  02/28/16 1801 02/29/16 1443  NA 135 138  K 4.2 3.9  CL 102 102  CO2 22 27  BUN 13 10  CREATININE 0.82 0.91  GLUCOSE 106* 128*  CALCIUM 9.5 9.3   No results for input(s): LABPT, INR in the last 72 hours. Results for orders placed or performed during the hospital encounter of 02/28/16  Aerobic Culture (superficial specimen)     Status: None   Collection Time: 02/28/16  4:30 PM  Result Value Ref Range Status   Specimen Description WOUND RIGHT FINGER  Final   Special Requests NONE  Final   Gram Stain NO WBC SEEN NO ORGANISMS SEEN   Final   Culture FEW STAPHYLOCOCCUS AUREUS  Final   Report Status 03/02/2016 FINAL  Final   Organism ID, Bacteria STAPHYLOCOCCUS AUREUS  Final      Susceptibility   Staphylococcus aureus - MIC*    CIPROFLOXACIN <=0.5 SENSITIVE Sensitive     ERYTHROMYCIN <=0.25 SENSITIVE Sensitive     GENTAMICIN <=0.5 SENSITIVE Sensitive     OXACILLIN <=0.25  SENSITIVE Sensitive     TETRACYCLINE <=1 SENSITIVE Sensitive     VANCOMYCIN <=0.5 SENSITIVE Sensitive     TRIMETH/SULFA <=10 SENSITIVE Sensitive     CLINDAMYCIN <=0.25 SENSITIVE Sensitive     RIFAMPIN <=0.5 SENSITIVE Sensitive     Inducible Clindamycin NEGATIVE Sensitive     * FEW STAPHYLOCOCCUS AUREUS   The patient is alert and oriented in no acute distress. The patient complains of pain in the affected upper extremity.  The patient is noted to have a normal HEENT exam. Lung fields show equal chest expansion and no shortness of breath. Abdomen exam is nontender without distention. Lower extremity examination does not show any fracture dislocation or blood clot symptoms. Pelvis is stable and the neck and back are stable and nontender. Examination of the right hand appears small finger once his dressings are removed shows that he has had continual improvement in the erythema, improvement in the overall edema about the digits this is still of course localize to the distal tip. Wet wicks are removed from the wounds 2. There is no purulent discharge present passive range of motion to the distal tip is improved in terms of pain. He has a developing area of eschar about the dorsal aspect of the finger. No signs of flexor tenosynovitis are present.  Assessment/Plan:  Patient Active Problem List   Diagnosis Date Noted  . 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 HEPATITIS C 04/06/2010  . SMOKER 04/06/2010  . DEPRESSION 04/06/2010  . GERD 04/06/2010  . DIARRHEA, CHRONIC 04/06/2010  . ALCOHOL ABUSE, HX OF 04/06/2010  . COCAINE ABUSE, HX OF 04/06/2010   we have discussed with the patient all issues and have performed repeat irrigation and debridement to the wounds about the small finger right hand. We have discussed with him tentative discharge today versus tomorrow pending PICC line placement as well as arranging home health care for Ancef administration for 4  weeks. The patient will need to see [place] Friday morning for dressing changes. We have discussed with him wet to dry dressings daily/wound care.    Nasiya Pascual L 03/02/2016, 8:29 AM

## 2016-03-02 NOTE — Progress Notes (Signed)
Peripherally Inserted Central Catheter/Midline Placement  The IV Nurse has discussed with the patient and/or persons authorized to consent for the patient, the purpose of this procedure and the potential benefits and risks involved with this procedure.  The benefits include less needle sticks, lab draws from the catheter, and the patient may be discharged home with the catheter. Risks include, but not limited to, infection, bleeding, blood clot (thrombus formation), and puncture of an artery; nerve damage and irregular heartbeat and possibility to perform a PICC exchange if needed/ordered by physician.  Alternatives to this procedure were also discussed.  B[facility] [employer] PICC patient education guide, fact sheet on infection prevention and patient information card has been provided to patient /or left at bedside.    PICC/Midline Placement Documentation        Todd Choi 03/02/2016, 2:15 PM

## 2016-03-02 NOTE — Discharge Summary (Signed)
Physician Discharge Summary  Patient ID: Todd Choi MRN: [id number] DOB/AGE: [birth year] 55 y.o.  Admit date: 02/28/2016 Discharge date:  03/03/16 Admission Diagnoses: Septic distal interphalangeal joint right small finger Past Medical History:  Diagnosis Date  . Hepatitis    HEP C     WITH TREATMENT  . 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 Intermetacarpal/nerve block, right hand and upper extremity. 2. Extensor tendon tenolysis, tenosynovectomy, right small finger,     extensive in nature. 3. Arthrotomy/synovectomy, right small finger distal interphalangeal     joint. 4. Irrigation and debridement of deep abscess, right interphalangeal     joint.   Consultants: Infectious disease  Discharged Condition: Improved  The patient presented with history of progressive swelling and pain about the right small finger dip joint, he had seen urgent care and ultimately was [facility] emergency room where he underwent I&D. Patient has been on keflex as well as Bactrim DS. Unfortunately, he continued to have pain and progressive difficulties and was ultimately seen by Dr. Amedeo Plenty in the emergency room and underwent the above procedure. He tolerated this well and was admitted with infectious disease consult implemented. Staph A. Ultimately was isolated with Ancef 2 grams to be administered for a total of 4 weeks. His post op course was uncomplicated and he underwent repeat bedside I&D's without difficulty. Wound care was discussed and the patient will see [place] in our office Friday 12/8. HHRN thru [facility] has been arranged. Patient will follow up with Infectious disease per their recommendations.  They were given perioperative antibiotics: Anti-infectives    Start     Dose/Rate Route Frequency Ordered Stop   03/02/16 1200  ceFAZolin (ANCEF) IVPB 2g/100 mL  premix     2 g 200 mL/hr over 30 Minutes Intravenous Every 8 hours 03/01/16 1444     03/02/16 0000  ceFAZolin (ANCEF) 2-4 GM/100ML-% IVPB     2 g 200 mL/hr over 30 Minutes Intravenous Every 8 hours 03/02/16 1630 03/27/16 2359   03/01/16 1445  ceFAZolin (ANCEF) IVPB 2g/100 mL premix  Status:  Discontinued     2 g 200 mL/hr over 30 Minutes Intravenous Every 8 hours 03/01/16 1444 03/01/16 1444   02/29/16 1300  cefTRIAXone (ROCEPHIN) 2 g in dextrose 5 % 50 mL IVPB  Status:  Discontinued     2 g 100 mL/hr over 30 Minutes Intravenous Every 24 hours 02/29/16 1131 03/01/16 1444   02/29/16 0230  vancomycin (VANCOCIN) IVPB 1000 mg/200 mL premix  Status:  Discontinued     1,000 mg 200 mL/hr over 60 Minutes Intravenous Every 8 hours 02/28/16 1724 03/01/16 1444   02/29/16 0200  piperacillin-tazobactam (ZOSYN) IVPB 3.375 g  Status:  Discontinued     3.375 g 12.5 mL/hr over 240 Minutes Intravenous Every 8 hours 02/28/16 1724 02/29/16 1131   02/28/16 1730  piperacillin-tazobactam (ZOSYN) IVPB 3.375 g     3.375 g 100 mL/hr over 30 Minutes Intravenous  Once 02/28/16 1724 02/28/16 1856   02/28/16 1730  vancomycin (VANCOCIN) 1,750 mg in sodium chloride 0.9 % 500 mL IVPB     1,750 mg 250 mL/hr over 120 Minutes Intravenous  Once 02/28/16 1724 02/28/16 2025    .  They were given sequential compression devices, early ambulation, for DVT prophylaxis.  Recent vital signs: Patient Vitals for the past 24 hrs:  BP Temp Temp src Pulse Resp SpO2  03/02/16 1447 117/78 98.3 F (36.8 C) - 69 18 100 %  03/02/16 0410 109/75 97.9 F (36.6 C) Oral 79 16 96 %  03/01/16 1950 119/80 98.2 F (36.8 C) Oral 79 16 98 %  03/01/16 1713 104/68 97.9 F (36.6 C) - 83 18 95 %  .  Recent laboratory studies: No results found.  Discharge Medications:     Medication List    STOP taking these medications   cephALEXin 500 MG capsule Commonly known as:  KEFLEX   sulfamethoxazole-trimethoprim 800-160 MG tablet Commonly  known as:  BACTRIM DS,SEPTRA DS     TAKE these medications   ceFAZolin 2-4 GM/100ML-% IVPB Commonly known as:  ANCEF Inject 100 mLs (2 g total) into the vein every 8 (eight) hours.   ibuprofen 800 MG tablet Commonly known as:  ADVIL,MOTRIN Take 800 mg by mouth 3 (three) times daily as needed for moderate pain.       Diagnostic Studies: Dg Hand Complete Right  Result Date: 02/28/2016 CLINICAL DATA:  Pain to RIGHT little finger. Swollen and red. Burn injury. EXAM: RIGHT HAND - COMPLETE 3+ VIEW COMPARISON:  Fifth digit x-ray 02/25/2016. FINDINGS: There is no evidence of fracture or dislocation. Marked soft tissue swelling is noted particularly of the fifth digit. 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 IMPRESSION: Marked soft tissue swelling.  No definite osseous reaction. Electronically Signed   By: Staci Righter M.D.   On: 02/28/2016 12:07   Dg Finger Little Right  Result Date: 02/25/2016 CLINICAL DATA:  Burn injury to right little finger EXAM: RIGHT LITTLE FINGER 2+V COMPARISON:  None. FINDINGS: The bones are osteopenic. There is distal interphalangeal joint space narrowing can't moderate soft tissue swelling. There is no focal osteolysis or other acute bone abnormality. IMPRESSION: 1. Normal no acute osseous abnormality of the right fifth digit. Moderate soft tissue swelling. 2. Moderate distal interphalangeal joint osetoarthrosis. Electronically Signed   By: Ulyses Jarred M.D.   On: 02/25/2016 23:38    They benefited maximally from their hospital stay and there were no complications.     Disposition: 01-Home or Self Care Discharge Instructions    Call MD / Call 911    Complete by:  As directed    If you experience chest pain or shortness of breath, CALL 911 and be transported to the hospital emergency room.  If you develope a fever above 101 F, pus (white drainage) or increased drainage or redness at the wound, or calf pain, call your surgeon's office.    Constipation Prevention    Complete by:  As directed    Drink plenty of fluids.  Prune juice may be helpful.  You may use a stool softener, such as Colace (over the counter) 100 mg twice a day.  Use MiraLax (over the counter) for constipation as needed.   Diet - low sodium heart healthy    Complete by:  As directed    Discharge instructions    Complete by:  As directed    Keep bandage clean and dry.  Call for any problems.  No smoking.  Criteria for driving a car: you should be off your pain medicine for 7-8 hours, able to drive one handed(confident), thinking clearly and feeling able in your judgement to drive. Continue elevation as it will decrease swelling.  If instructed by MD move your fingers within the confines of the bandage/splint.  Use ice if instructed by your MD. Call immediately for any  sudden loss of feeling in your hand/arm or change in functional abilities of the extremity. We recommend that you to take vitamin C 1000 mg a day to promote healing. We also recommend that if you require  pain medicine that you take a stool softener to prevent constipation as most pain medicines will have constipation side effects. We recommend either Peri-Colace or Senokot and recommend that you also consider adding MiraLAX as well to prevent the constipation affects from pain medicine if you are required to use them. These medicines are over the counter and may be purchased at a local pharmacy. A cup of yogurt and a probiotic can also be helpful during the recovery process as the medicines can disrupt your intestinal environment.   Increase activity slowly as tolerated    Complete by:  As directed      Follow-up Information    [facility] Follow up.   Why:  Someone from [facility] will contact you to arrange start date and time for therapy.  Contact information: [place] [address] [phone]        Paulene Floor, MD Follow up on  03/04/2016.   Specialty:  Orthopedic Surgery Why:  followup friday at 10:00am, call for questions or concerns Contact information: [address] [place] [address] [id number]        [facility], Dani Gobble, NP .   Specialty:  Nurse Practitioner Contact information: [address] [place] [id number] [phone]            Signed: Ivan Croft 03/02/2016, 4:32 PM

## 2016-03-02 NOTE — Discharge Instructions (Signed)

## 2016-03-02 NOTE — Care Management Note (Addendum)
Case Management Note  Patient Details  Name: Todd Choi MRN: [id number] Date of Birth: Jul 26, 1960  Subjective/Objective:   55 yr old gentleman s/p I & D  Of right infected small finger .               Action/Plan:  Case manager discussed plan for Home Health with patient.  He will need to go home on IV Ancef for 4 weeks.  Patient having PICC line placed today. Carolynn Sayers, [place] IV specialist has spoken with patient and has begun education.    Expected Discharge Date:   03/03/16               Expected Discharge Plan:  [name]  In-House Referral:  NA  Discharge planning Services  CM Consult  Post Acute Care Choice:  Home Health Choice offered to:  NA  DME Arranged:    DME Agency:  NA  HH Arranged:  RN [place] Agency:  [place]  Status of Service:  Completed, signed off  If discussed at [place] of Stay Meetings, dates discussed:    Additional Comments:  Ninfa Meeker, RN 03/02/2016, 2:29 PM

## 2016-03-03 LAB — CBC WITH DIFFERENTIAL/PLATELET
Basophils Absolute: 0 10*3/uL (ref 0.0–0.1)
Basophils Relative: 0 %
EOS PCT: 3 %
Eosinophils Absolute: 0.3 10*3/uL (ref 0.0–0.7)
HCT: 41.5 % (ref 39.0–52.0)
Hemoglobin: 14.2 g/dL (ref 13.0–17.0)
LYMPHS PCT: 33 %
Lymphs Abs: 2.5 10*3/uL (ref 0.7–4.0)
MCH: 29.6 pg (ref 26.0–34.0)
MCHC: 34.2 g/dL (ref 30.0–36.0)
MCV: 86.6 fL (ref 78.0–100.0)
Monocytes Absolute: 0.9 10*3/uL (ref 0.1–1.0)
Monocytes Relative: 12 %
Neutro Abs: 3.9 10*3/uL (ref 1.7–7.7)
Neutrophils Relative %: 52 %
PLATELETS: 277 10*3/uL (ref 150–400)
RBC: 4.79 MIL/uL (ref 4.22–5.81)
RDW: 13.5 % (ref 11.5–15.5)
WBC: 7.6 10*3/uL (ref 4.0–10.5)

## 2016-03-03 LAB — ACID FAST SMEAR (AFB)

## 2016-03-03 LAB — ACID FAST SMEAR (AFB, MYCOBACTERIA): Acid Fast Smear: NEGATIVE

## 2016-03-03 MED ORDER — HEPARIN SOD (PORK) LOCK FLUSH 100 UNIT/ML IV SOLN
250.0000 [IU] | INTRAVENOUS | Status: AC | PRN
  Administered 2016-03-03: 250 [IU]

## 2016-03-03 NOTE — Care Management (Signed)
Case manager spoke with patient and his mother to address his concerns about cost of Home health RN and tx. Case manager advised patient to discuss finances with his mom and to speak with [facility] to arrange a payment plan. Patient and CM discussed importance of continuing IV antibiotics as prescribed. Patient states he will continue to work with [place].

## 2016-03-03 NOTE — Progress Notes (Signed)
Patient ID: Todd Choi, male   DOB: [birth year], 55 y.o.   MRN: [id number] The patient is seen and evaluated this morning he is doing well overall he has not taking any pain medication since yesterday. He has no complaints. He is eager for discharge. Repeat irrigation about the wounds is performed at bedside he tolerated this well he is had significant improvement in the overall erythema no significant purulence is present. There is improved. We will plan for discharge this morning [facility] has been contacted for administration of IV Ancef. He will need this for a period time, he will continue IV Ancef and through December 31, 2 g IV every 8. We will see him tomorrow morning at 10 AM. I'll questions were encouraged and answered. Please see discharge summary and orders for full details.

## 2016-03-03 NOTE — Progress Notes (Signed)
Reviewed discharge information/medications with patient.  Waiting on IV team to cap his PICC line off for home use.  Also waiting to speak with home health agency again before he leaves.

## 2016-03-07 ENCOUNTER — Encounter: Payer: Self-pay | Admitting: Infectious Diseases

## 2016-03-14 ENCOUNTER — Encounter: Payer: Self-pay | Admitting: Infectious Diseases

## 2016-03-16 ENCOUNTER — Telehealth: Payer: Self-pay | Admitting: *Deleted

## 2016-03-16 NOTE — Telephone Encounter (Signed)
Patient called, stating that he does not think the antibiotics are working.  His finger is swelling, painful.  He sees the surgeon tomorrow morning, but when he called the surgeon's office, they asked him to call here. Please advise. Landis Gandy, RN

## 2016-03-17 ENCOUNTER — Telehealth: Payer: Self-pay | Admitting: Pharmacist

## 2016-03-17 NOTE — Telephone Encounter (Signed)
Patient called here yesterday complaining of increased pain and swelling in his finger.  He had an appt with Avelina Laine from [facility] ortho office.  Dr. Jenean Lindau called here after seeing patient and said the patient was not taking Ancef because he thought his finger was getting worse and not better.  Ancef also gave him nausea.  Dr. Jenean Lindau asked if pt could be seen this week or early next week. Pt does not have f/u with Dr. Johnnye Sima until 1/9.  I called patient and asked him what was going on.  He states he did not take the Ancef yesterday or today due to nausea and because he thinks it is not working.  His end date is 12/31. He is asking if he needs another abx added or to change.  Pt also said ortho office gave him some low dose Reglan for nausea.  He also said ortho office said "the bone was not getting any better". I explained to patient the importance of not missing any more doses.   I explained that he had MSSA, and Ancef was the go to antibiotic for that infection.  I instructed him to take the Reglan 30 minutes before each dose of Ancef and to continue taking it. He follows up with ortho again next Wednesday 12/27. He states that his finger has just felt worse the last 1-2 days, and that it was getting better.  Last labs I have on him are from 12/18, ESR 6, and CRP 0.7. Told him to call us after appointment on Wednesday if finger was not any better.  He felt better after conversation and will let us know.

## 2016-03-22 ENCOUNTER — Encounter: Payer: Self-pay | Admitting: Infectious Diseases

## 2016-03-29 ENCOUNTER — Encounter: Payer: Self-pay | Admitting: Infectious Diseases

## 2016-03-31 LAB — FUNGUS CULTURE WITH STAIN

## 2016-03-31 LAB — FUNGUS CULTURE RESULT

## 2016-03-31 LAB — FUNGAL ORGANISM REFLEX

## 2016-04-05 ENCOUNTER — Telehealth: Payer: Self-pay | Admitting: Pharmacist

## 2016-04-05 ENCOUNTER — Encounter: Payer: Self-pay | Admitting: Infectious Diseases

## 2016-04-05 ENCOUNTER — Ambulatory Visit (INDEPENDENT_AMBULATORY_CARE_PROVIDER_SITE_OTHER): Payer: Self-pay | Admitting: Infectious Diseases

## 2016-04-05 DIAGNOSIS — M009 Pyogenic arthritis, unspecified: Secondary | ICD-10-CM

## 2016-04-05 DIAGNOSIS — M00841 Arthritis due to other bacteria, right hand: Secondary | ICD-10-CM

## 2016-04-05 DIAGNOSIS — B1710 Acute hepatitis C without hepatic coma: Secondary | ICD-10-CM

## 2016-04-05 NOTE — Assessment & Plan Note (Signed)
Has been followed at [facility].  Completed 6 months ago.  States his repeat RNA is (-).

## 2016-04-05 NOTE — Assessment & Plan Note (Addendum)
Will change his anbx to vanco.  I am not sure ceftriaxone is working, I typically don't use for staph.  Would like to use dapto but since he is self pay I am not sure this is an option.  Will see him back in 4 weeks.

## 2016-04-05 NOTE — Telephone Encounter (Signed)
Called and spoke to Amy, PharmD at [facility] and gave verbal order from Dr. Johnnye Sima to stop patient's Rocephin and change to Vancomycin x 1 month until 05/06/16. CBC, BMET, ESR, CRP weekly, vanc trough as needed. Amy verbalized understanding.
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
## 2016-04-06 ENCOUNTER — Telehealth: Payer: Self-pay | Admitting: *Deleted

## 2016-04-06 NOTE — Telephone Encounter (Signed)
Jenny Reichmann RN with [facility] called to inform the doctor the patient refused to start IV therapy today but agreed to let them come out tomorrow. He is a little put off about length of daily infusion and she is worried he will not comply. Advised all we can do it try. She advised will call back if anything changes or if he refused tomorrow 04/07/16.

## 2016-04-07 NOTE — Telephone Encounter (Signed)
Patient refused morning dose of vancomycin. Jenny Reichmann, RN states patient was told he will need only 1 dose/day of iv medication, 1 lab visit per week.  Patient wants only that, no more. Please advise.  Landis Gandy, RN

## 2016-04-07 NOTE — Telephone Encounter (Signed)
Talked to [place] at [facility]. Dapto will cost him $501/day. Jeani Hawking said the patient told him that if he HAD to do twice daily that he would.  Jeani Hawking is going to call patient and convince him to do twice daily until we can at least get a vanc trough and see if we can get him on daily dosing. Told him that I would call patient and reiterate if needed.

## 2016-04-08 NOTE — Telephone Encounter (Signed)
Thanks Cassie 

## 2016-04-11 ENCOUNTER — Telehealth: Payer: Self-pay | Admitting: *Deleted

## 2016-04-11 NOTE — Telephone Encounter (Signed)
Jenny Reichmann, nurse with [facility] called to notify Dr. Johnnye Sima that patient is only allowing them to come for labs once a week. The order was for twice weekly. Myrtis Hopping

## 2016-04-14 ENCOUNTER — Encounter: Payer: Self-pay | Admitting: Infectious Diseases

## 2016-04-14 LAB — ACID FAST CULTURE WITH REFLEXED SENSITIVITIES: ACID FAST CULTURE - AFSCU3: NEGATIVE

## 2016-04-14 LAB — ACID FAST CULTURE WITH REFLEXED SENSITIVITIES (MYCOBACTERIA)

## 2016-04-21 ENCOUNTER — Telehealth: Payer: Self-pay

## 2016-04-21 NOTE — Telephone Encounter (Signed)
Cindy from [facility] called to tell me that [facility] will now only be going out to see Pt once a week and will be drawing labs once a week as well. Pt is still on Vancomycin.

## 2016-04-26 ENCOUNTER — Encounter: Payer: Self-pay | Admitting: Infectious Diseases

## 2016-05-03 ENCOUNTER — Telehealth: Payer: Self-pay | Admitting: *Deleted

## 2016-05-03 NOTE — Telephone Encounter (Signed)
Jenny Reichmann, [place], called to let Dr Johnnye Sima know that the patient has refused a nurse visit this week for lab draw and for PICC dressing change.  Per Jenny Reichmann, patient states he cannot afford any more nurse visits, he wants to get his PICC care at his appointment with [facility] tomorrow. Per Jenny Reichmann, patient is scheduled to complete IV antibiotics 2/9, but at least one dose has been held due to high vancomycin trough.  Today's lab was to recheck his level.  Please advise if he will be keeping his PICC past his appointment 2/7. Landis Gandy, RN

## 2016-05-03 NOTE — Telephone Encounter (Signed)
stop anbx Pull PIC at visit

## 2016-05-03 NOTE — Telephone Encounter (Signed)
Notified E[place].

## 2016-05-04 ENCOUNTER — Encounter: Payer: Self-pay | Admitting: Infectious Diseases

## 2016-05-04 ENCOUNTER — Ambulatory Visit (INDEPENDENT_AMBULATORY_CARE_PROVIDER_SITE_OTHER): Payer: Self-pay | Admitting: Infectious Diseases

## 2016-05-04 VITALS — BP 152/83 | HR 82 | Temp 98.3°F | Ht 72.0 in | Wt 188.0 lb

## 2016-05-04 DIAGNOSIS — M009 Pyogenic arthritis, unspecified: Secondary | ICD-10-CM

## 2016-05-04 DIAGNOSIS — M00841 Arthritis due to other bacteria, right hand: Secondary | ICD-10-CM

## 2016-05-04 NOTE — Assessment & Plan Note (Signed)
He is doing well Has been on anbx irregularly as evidence by his eratic vanco levels.  His inflammatory markers are quite good.  Will stop his anbx, pull his PIC.  See him back prn Return to work note written.
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
## 2016-05-04 NOTE — Progress Notes (Signed)
Per Dr Johnnye Sima  46cm  Single lumen Peripherally Inserted Central Catheter  removed from right basilic . No sutures present. Dressing was clean and dry . Area cleansed with chlorhexidine and petroleum dressing applied. Pt advised no heavy lifting with this arm, leave dressing for 24 hours and call the office if dressing becomes soaked with blood or sharp pain presents.  Pt tolerated procedure well.    Laverle Patter, RN

## 2016-05-04 NOTE — Addendum Note (Signed)
Addended by: Laverle Patter on: 05/04/2016 05:28 PM   Modules accepted: Orders

## 2016-08-11 NOTE — Progress Notes (Addendum)
HPI:   [name] is a 56 y.o. male, who is here today to establish care.  Former PCP: N/A Last preventive routine visit: 20 years.  Chronic medical problems: Hx of hepatis C (treated), past Hx of substance abuse. 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 05/20/16 CRP < 5.0.  Lower back pain , no radiated, achy  Concerns today:  Insomnia: Girlfriend has noted that he is "gasping for air" at night, no apneas.   Difficulty staying asleep. He does not feel rested when he first wakes up. Sleeps about 7-8 hours but wakes up every 2 hours.   GI issues:  Bloating sensation and "gas", attributed to abx treatment he took to treat osteomyelitis. Colonoscopy 5-7 years ago,polypectomy. He has not identified exacerbating or alleviating factors. Denies abdominal pain, nausea, vomiting, changes in bowel habits, blood in stool or melena.   Tobacco use:  He is interested in smoking cessation. He was on Wellbutrin in the past for depression and he did not smoke during the time he took medication, he did not feel the urge to do so. He is on Nicotine patch ? Mg.  He also took Lexapro, which helped with "hyperness" Anxiety seems better now that he is older: "I am ok", after "spiritual program" that helped with drug use.    -He also would like to have some lab work: Lipid and diabetes mainly.  He started exercising regularly a couple months ago. He eats "pretty healthy"   Review of Systems  Constitutional: Positive for fatigue. Negative for activity change, appetite change, fever and unexpected weight change.  HENT: Negative for nosebleeds, sore throat and trouble swallowing.   Eyes: Negative for redness and visual disturbance.  Respiratory: Negative for cough, shortness of breath and wheezing.   Cardiovascular: Negative for chest pain, palpitations and leg swelling.  Gastrointestinal:  Positive for abdominal distention. Negative for abdominal pain, blood in stool, nausea and vomiting.  Endocrine: Negative for cold intolerance, heat intolerance, polydipsia, polyphagia and polyuria.  Genitourinary: Negative for decreased urine volume and hematuria.  Musculoskeletal: Positive for back pain (lower back pain). Negative for gait problem.  Skin: Negative for pallor and rash.  Neurological: Negative for syncope, weakness and headaches.  Psychiatric/Behavioral: Positive for sleep disturbance. Negative for confusion. The patient is nervous/anxious.      No current outpatient prescriptions on file prior to visit.   No current facility-administered medications on file prior to visit.     Past Medical History:  Diagnosis Date  . Depression   . Hepatitis    HEP C     WITH TREATMENT  . History of drug abuse    IN RECOVERY   No Known Allergies  Family History  Problem Relation Age of Onset  . Cancer Neg Hx   . Diabetes Neg Hx     Social History   Social History  . Marital status: Divorced    Spouse name: N/A  . Number of children: N/A  . Years of education: N/A   Social History Main Topics  . Smoking status: Current Every Day Smoker    Years: 35.00  . Smokeless tobacco: Never Used  . Alcohol use No     Comment: HISTORY OF   . Drug use: Yes    Types: Cocaine  . Sexual activity: Yes   Other Topics Concern  . None   Social History Narrative  . None    Vitals:  08/12/16 0756  BP: 130/80  Pulse: 72  Resp: 12  O2 sat at RA 95% Body mass index is 25.23 kg/m.   Physical Exam  Nursing note and vitals reviewed. Constitutional: He is oriented to person, place, and time. He appears well-developed. No distress.  HENT:  Head: Atraumatic.  Mouth/Throat: Oropharynx is clear and moist and mucous membranes are normal. Abnormal dentition. Dental caries present.  Eyes: Conjunctivae and EOM are normal. Pupils are equal, round, and reactive to light.  Neck: No  tracheal deviation present. No thyroid mass and no thyromegaly present.  Cardiovascular: Normal rate and regular rhythm.   No murmur heard. Pulses:      Dorsalis pedis pulses are 2+ on the right side, and 2+ on the left side.  Respiratory: Effort normal and breath sounds normal. No respiratory distress.  GI: Soft. Bowel sounds are normal. He exhibits no mass. There is no hepatomegaly. There is no tenderness.  Musculoskeletal: He exhibits no edema.  Lymphadenopathy:    He has no cervical adenopathy.  Neurological: He is alert and oriented to person, place, and time. He has normal strength. Gait normal.  Skin: Skin is warm. No erythema.  Psychiatric: His mood appears anxious.  Appropriately groomed, good eye contact.    ASSESSMENT AND PLAN:   Todd Choi was seen today for establish care.  Diagnoses and all orders for this visit:  Chronic fatigue  Possible etiologies discussed. He prefers to hold on sleep study for now. Further recommendations will be given according to lab results.  -     Comprehensive metabolic panel  -     TSH -     CBC with Differential/Platelet  Insomnia, unspecified type  Good sleep hygiene recommended. OTC Melatonin ER 5 mg daily.  Colon cancer screening -     Ambulatory referral to Gastroenterology  Lipid screening -     Lipid panel  Tobacco use disorder  Adverse effects of tobacco discussed. Some side effects of Wellbutrin discussed. He would like to continue with Nicotine patch also.   -     buPROPion (WELLBUTRIN SR) 150 MG 12 hr tablet; Take 1 tablet (150 mg total) by mouth 2 (two) times daily.   Abdominal bloating  Probiotic may help. 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   Betty G. Martinique, MD  [facility]. [place] office.

## 2016-08-12 ENCOUNTER — Ambulatory Visit (INDEPENDENT_AMBULATORY_CARE_PROVIDER_SITE_OTHER): Payer: BLUE CROSS/BLUE SHIELD | Admitting: Family Medicine

## 2016-08-12 ENCOUNTER — Encounter: Payer: Self-pay | Admitting: Family Medicine

## 2016-08-12 VITALS — BP 130/80 | HR 72 | Resp 12 | Ht 72.0 in | Wt 186.0 lb

## 2016-08-12 DIAGNOSIS — R5382 Chronic fatigue, unspecified: Secondary | ICD-10-CM

## 2016-08-12 DIAGNOSIS — G4700 Insomnia, unspecified: Secondary | ICD-10-CM | POA: Diagnosis not present

## 2016-08-12 DIAGNOSIS — Z1211 Encounter for screening for malignant neoplasm of colon: Secondary | ICD-10-CM | POA: Diagnosis not present

## 2016-08-12 DIAGNOSIS — Z13220 Encounter for screening for lipoid disorders: Secondary | ICD-10-CM | POA: Diagnosis not present

## 2016-08-12 DIAGNOSIS — R140 Abdominal distension (gaseous): Secondary | ICD-10-CM

## 2016-08-12 DIAGNOSIS — F17200 Nicotine dependence, unspecified, uncomplicated: Secondary | ICD-10-CM | POA: Diagnosis not present

## 2016-08-12 LAB — COMPREHENSIVE METABOLIC PANEL
ALBUMIN: 4.7 g/dL (ref 3.5–5.2)
ALK PHOS: 44 U/L (ref 39–117)
ALT: 17 U/L (ref 0–53)
AST: 13 U/L (ref 0–37)
BUN: 20 mg/dL (ref 6–23)
CALCIUM: 9.7 mg/dL (ref 8.4–10.5)
CO2: 30 mEq/L (ref 19–32)
Chloride: 103 mEq/L (ref 96–112)
Creatinine, Ser: 0.8 mg/dL (ref 0.40–1.50)
GFR: 106.35 mL/min (ref >60.00)
GLUCOSE: 100 mg/dL — AB (ref 70–99)
Potassium: 4.4 mEq/L (ref 3.5–5.1)
SODIUM: 138 meq/L (ref 135–145)
Total Bilirubin: 0.7 mg/dL (ref 0.2–1.2)
Total Protein: 7.4 g/dL (ref 6.0–8.3)

## 2016-08-12 LAB — LIPID PANEL
Cholesterol: 163 mg/dL (ref 0–200)
HDL: 32 mg/dL — AB (ref >39.00)
LDL Cholesterol: 102 mg/dL — ABNORMAL HIGH (ref 0–99)
NONHDL: 131.21
TRIGLYCERIDES: 148 mg/dL (ref 0.0–149.0)
Total CHOL/HDL Ratio: 5
VLDL: 29.6 mg/dL (ref 0.0–40.0)

## 2016-08-12 LAB — CBC WITH DIFFERENTIAL/PLATELET
BASOS ABS: 0 10*3/uL (ref 0.0–0.1)
BASOS PCT: 0.4 % (ref 0.0–3.0)
Eosinophils Absolute: 0.2 10*3/uL (ref 0.0–0.7)
Eosinophils Relative: 2.3 % (ref 0.0–5.0)
HEMATOCRIT: 46.3 % (ref 39.0–52.0)
Hemoglobin: 15.8 g/dL (ref 13.0–17.0)
LYMPHS ABS: 2.5 10*3/uL (ref 0.7–4.0)
LYMPHS PCT: 33 % (ref 12.0–46.0)
MCHC: 34.2 g/dL (ref 30.0–36.0)
MCV: 87.2 fl (ref 78.0–100.0)
MONOS PCT: 10.5 % (ref 3.0–12.0)
Monocytes Absolute: 0.8 10*3/uL (ref 0.1–1.0)
NEUTROS PCT: 53.8 % (ref 43.0–77.0)
Neutro Abs: 4.1 10*3/uL (ref 1.4–7.7)
PLATELETS: 219 10*3/uL (ref 150.0–400.0)
RBC: 5.31 Mil/uL (ref 4.22–5.81)
RDW: 13.3 % (ref 11.5–15.5)
WBC: 7.6 10*3/uL (ref 4.0–10.5)

## 2016-08-12 LAB — TSH: TSH: 1.22 u[IU]/mL (ref 0.35–4.50)

## 2016-08-12 MED ORDER — BUPROPION HCL ER (SR) 150 MG PO TB12: 150.0000 mg | ORAL_TABLET | Freq: Two times a day (BID) | ORAL | 2 refills | Status: DC

## 2016-08-12 NOTE — Patient Instructions (Addendum)
A few things to remember from today's visit:   Colon cancer screening - Plan: Ambulatory referral to Gastroenterology  Lipid screening  Chronic fatigue - Plan: Comprehensive metabolic panel, Lipid panel, TSH, CBC with Differential/Platelet  Tobacco use disorder - Plan: buPROPion (WELLBUTRIN SR) 150 MG 12 hr tablet  Try to avoid lactose ,spicy, and food than can irritate stomach.  Beano   Melatonin 5 mg extended release.  Please be sure medication list is accurate. If a new problem present, please set up appointment sooner than planned today.

## 2016-08-16 ENCOUNTER — Encounter: Payer: Self-pay | Admitting: Internal Medicine

## 2016-08-16 ENCOUNTER — Telehealth: Payer: Self-pay | Admitting: Family Medicine

## 2016-08-16 NOTE — Telephone Encounter (Signed)
Informed patient of results and patient verbalized understanding.  

## 2016-08-16 NOTE — Telephone Encounter (Signed)
Pt calling for lab results from 08/12/16 pt is aware that someone from the office will give him a call once they are ready.

## 2016-08-16 NOTE — Telephone Encounter (Signed)
error 

## 2016-09-14 ENCOUNTER — Encounter: Payer: Self-pay | Admitting: Internal Medicine

## 2016-09-14 ENCOUNTER — Encounter (INDEPENDENT_AMBULATORY_CARE_PROVIDER_SITE_OTHER): Payer: Self-pay

## 2016-09-14 ENCOUNTER — Ambulatory Visit (INDEPENDENT_AMBULATORY_CARE_PROVIDER_SITE_OTHER): Payer: BLUE CROSS/BLUE SHIELD | Admitting: Internal Medicine

## 2016-09-14 VITALS — BP 100/64 | HR 76 | Ht 72.0 in | Wt 186.0 lb

## 2016-09-14 DIAGNOSIS — R109 Unspecified abdominal pain: Secondary | ICD-10-CM

## 2016-09-14 DIAGNOSIS — R140 Abdominal distension (gaseous): Secondary | ICD-10-CM

## 2016-09-14 DIAGNOSIS — Z86010 Personal history of colonic polyps: Secondary | ICD-10-CM

## 2016-09-14 DIAGNOSIS — K8020 Calculus of gallbladder without cholecystitis without obstruction: Secondary | ICD-10-CM

## 2016-09-14 DIAGNOSIS — B182 Chronic viral hepatitis C: Secondary | ICD-10-CM

## 2016-09-14 DIAGNOSIS — K219 Gastro-esophageal reflux disease without esophagitis: Secondary | ICD-10-CM | POA: Diagnosis not present

## 2016-09-14 MED ORDER — METRONIDAZOLE 250 MG PO TABS: 250.0000 mg | ORAL_TABLET | Freq: Three times a day (TID) | ORAL | 0 refills | Status: DC

## 2016-09-14 MED ORDER — NA SULFATE-K SULFATE-MG SULF 17.5-3.13-1.6 GM/177ML PO SOLN: 1.0000 | Freq: Once | ORAL | 0 refills | Status: AC
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
## 2016-09-14 NOTE — Patient Instructions (Signed)

## 2016-09-20 ENCOUNTER — Telehealth: Payer: Self-pay | Admitting: Internal Medicine

## 2016-09-20 NOTE — Telephone Encounter (Signed)
Pt calling back about the prep.  It needs a PA

## 2016-09-21 NOTE — Telephone Encounter (Signed)
Spoke to [employer] and advised that we have 50 dollar coupon for [employer]. Suprep did go through for 50 dollars.

## 2016-09-27 ENCOUNTER — Encounter: Payer: Self-pay | Admitting: Internal Medicine

## 2016-09-27 ENCOUNTER — Encounter: Payer: BLUE CROSS/BLUE SHIELD | Admitting: Internal Medicine

## 2016-09-27 ENCOUNTER — Ambulatory Visit (INDEPENDENT_AMBULATORY_CARE_PROVIDER_SITE_OTHER): Payer: BLUE CROSS/BLUE SHIELD | Admitting: Internal Medicine

## 2016-09-27 VITALS — BP 105/75 | HR 66 | Temp 98.2°F | Resp 14 | Ht 72.0 in | Wt 186.0 lb

## 2016-09-27 DIAGNOSIS — K2990 Gastroduodenitis, unspecified, without bleeding: Secondary | ICD-10-CM

## 2016-09-27 DIAGNOSIS — K2980 Duodenitis without bleeding: Secondary | ICD-10-CM | POA: Diagnosis not present

## 2016-09-27 DIAGNOSIS — K2970 Gastritis, unspecified, without bleeding: Secondary | ICD-10-CM | POA: Diagnosis present

## 2016-09-27 DIAGNOSIS — K2100 Gastro-esophageal reflux disease with esophagitis, without bleeding: Secondary | ICD-10-CM

## 2016-09-27 DIAGNOSIS — D120 Benign neoplasm of cecum: Secondary | ICD-10-CM | POA: Diagnosis not present

## 2016-09-27 DIAGNOSIS — R140 Abdominal distension (gaseous): Secondary | ICD-10-CM | POA: Diagnosis not present

## 2016-09-27 DIAGNOSIS — R1013 Epigastric pain: Secondary | ICD-10-CM | POA: Diagnosis not present

## 2016-09-27 DIAGNOSIS — Z86010 Personal history of colonic polyps: Secondary | ICD-10-CM | POA: Diagnosis not present

## 2016-09-27 MED ORDER — PANTOPRAZOLE SODIUM 40 MG PO TBEC: 40.0000 mg | DELAYED_RELEASE_TABLET | Freq: Every day | ORAL | 6 refills | Status: DC

## 2016-09-27 MED ORDER — SODIUM CHLORIDE 0.9 % IV SOLN: 500.0000 mL | INTRAVENOUS | Status: DC

## 2016-09-27 NOTE — Progress Notes (Signed)
A/ox3 pleased with MAC, report to Jane RN 

## 2016-09-27 NOTE — Progress Notes (Signed)
Called to room to assist during endoscopic procedure.  Patient ID and intended procedure confirmed with present staff. Received instructions for my participation in the procedure from the performing physician.  

## 2016-09-27 NOTE — Op Note (Signed)
[place] Patient Name: Todd Choi Procedure Date: 09/27/2016 8:46 AM MRN: [id number] Endoscopist: Docia Chuck. Henrene Pastor , MD Age: 56 Referring MD:  Date of Birth: [birth year] Gender: Male Account #: [id number] Procedure:                Colonoscopy, with cold snare polypectomy x 1 Indications:              High risk colon cancer surveillance: Personal                            history of non-advanced adenoma. Previous                            examinations 2003 in 2012 with Dr. Earlean Shawl Medicines:                Monitored Anesthesia Care Procedure:                Pre-Anesthesia Assessment:                           - Prior to the procedure, a History and Physical                            was performed, and patient medications and                            allergies were reviewed. The patient's tolerance of                            previous anesthesia was also reviewed. The risks                            and benefits of the procedure and the sedation                            options and risks were discussed with the patient.                            All questions were answered, and informed consent                            was obtained. Prior Anticoagulants: The patient has                            taken no previous anticoagulant or antiplatelet                            agents. ASA Grade Assessment: II - A patient with                            mild systemic disease. After reviewing the risks                            and benefits, the patient was deemed in  satisfactory condition to undergo the procedure.                           After obtaining informed consent, the colonoscope                            was passed under direct vision. Throughout the                            procedure, the patient's blood pressure, pulse, and                            oxygen saturations were monitored continuously. The    Colonoscope was introduced through the anus and                            advanced to the the cecum, identified by                            appendiceal orifice and ileocecal valve. The                            ileocecal valve, appendiceal orifice, and rectum                            were photographed. The quality of the bowel                            preparation was excellent. The colonoscopy was                            performed without difficulty. The patient tolerated                            the procedure well. The bowel preparation used was                            SUPREP. Scope In: 8:58:16 AM Scope Out: 9:15:41 AM Scope Withdrawal Time: 0 hours 13 minutes 43 seconds  Total Procedure Duration: 0 hours 17 minutes 25 seconds  Findings:                 A 5 mm polyp was found in the cecum. The polyp was                            sessile. The polyp was removed with a cold snare.                            Resection and retrieval were complete.                           Multiple diverticula were found in the left colon                            and right  colon.                           Internal hemorrhoids were found during retroflexion.                           The exam was otherwise without abnormality on                            direct and retroflexion views. Complications:            No immediate complications. Estimated blood loss:                            None. Estimated Blood Loss:     Estimated blood loss: none. Impression:               - One 5 mm polyp in the cecum, removed with a cold                            snare. Resected and retrieved.                           - Diverticulosis in the left colon and in the right                            colon.                           - Internal hemorrhoids.                           - The examination was otherwise normal on direct                            and retroflexion views. Recommendation:           -  Repeat colonoscopy in 5 years for surveillance.                           - Patient has a contact number available for                            emergencies. The signs and symptoms of potential                            delayed complications were discussed with the                            patient. Return to normal activities tomorrow.                            Written discharge instructions were provided to the                            patient.                           -  Resume previous diet.                           - Continue present medications.                           - Await pathology results.                           - EGD today. Please see report Docia Chuck. Henrene Pastor, MD 09/27/2016 9:20:30 AM This report has been signed electronically.

## 2016-09-27 NOTE — Patient Instructions (Signed)
YOU HAD AN ENDOSCOPIC PROCEDURE TODAY AT [facility]:   Refer to the procedure report that was given to you for any specific questions about what was found during the examination.  If the procedure report does not answer your questions, please call your gastroenterologist to clarify.  If you requested that your care partner not be given the details of your procedure findings, then the procedure report has been included in a sealed envelope for you to review at your convenience later.  YOU SHOULD EXPECT: Some feelings of bloating in the abdomen. Passage of more gas than usual.  Walking can help get rid of the air that was put into your GI tract during the procedure and reduce the bloating. If you had a lower endoscopy (such as a colonoscopy or flexible sigmoidoscopy) you may notice spotting of blood in your stool or on the toilet paper. If you underwent a bowel prep for your procedure, you may not have a normal bowel movement for a few days.  Please Note:  You might notice some irritation and congestion in your nose or some drainage.  This is from the oxygen used during your procedure.  There is no need for concern and it should clear up in a day or so.  SYMPTOMS TO REPORT IMMEDIATELY:   Following lower endoscopy (colonoscopy or flexible sigmoidoscopy):  Excessive amounts of blood in the stool  Significant tenderness or worsening of abdominal pains  Swelling of the abdomen that is new, acute  Fever of 100F or higher   Following upper endoscopy (EGD)  Vomiting of blood or coffee ground material  New chest pain or pain under the shoulder blades  Painful or persistently difficult swallowing  New shortness of breath  Fever of 100F or higher  Black, tarry-looking stools  For urgent or emergent issues, a gastroenterologist can be reached at any hour by calling [phone].   DIET:  We do recommend a small meal at first, but then you may proceed to your regular diet.  Drink  plenty of fluids but you should avoid alcoholic beverages for 24 hours.  ACTIVITY:  You should plan to take it easy for the rest of today and you should NOT DRIVE or use heavy machinery until tomorrow (because of the sedation medicines used during the test).    FOLLOW UP: Our staff will call the number listed on your records the next business day following your procedure to check on you and address any questions or concerns that you may have regarding the information given to you following your procedure. If we do not reach you, we will leave a message.  However, if you are feeling well and you are not experiencing any problems, there is no need to return our call.  We will assume that you have returned to your regular daily activities without incident.  If any biopsies were taken you will be contacted by phone or by letter within the next 1-3 weeks.  Please call us at [phone] if you have not heard about the biopsies in 3 weeks.    SIGNATURES/CONFIDENTIALITY: You and/or your care partner have signed paperwork which will be entered into your electronic medical record.  These signatures attest to the fact that that the information above on your After Visit Summary has been reviewed and is understood.  Full responsibility of the confidentiality of this discharge information lies with you and/or your care-partner.  Gastritis information given.  See Dr.Perry in 6-8 weeks in office  Pantoprazole 40mg . Once daily.  Rx. Sent to [facility].   Polyp, diverticulosis, high fiber diet and hemorrhoid information given.

## 2016-09-27 NOTE — Op Note (Signed)
[place] Patient Name: Todd Choi Procedure Date: 09/27/2016 8:44 AM MRN: [id number] Endoscopist: Docia Chuck. Henrene Pastor , MD Age: 56 Referring MD:  Date of Birth: 1960/12/22 Gender: Male Account #: [id number] Procedure:                Upper GI endoscopy, with biopsies Indications:              Dyspepsia, Esophageal reflux, Abdominal bloating Medicines:                Monitored Anesthesia Care Procedure:                Pre-Anesthesia Assessment:                           - Prior to the procedure, a History and Physical                            was performed, and patient medications and                            allergies were reviewed. The patient's tolerance of                            previous anesthesia was also reviewed. The risks                            and benefits of the procedure and the sedation                            options and risks were discussed with the patient.                            All questions were answered, and informed consent                            was obtained. Prior Anticoagulants: The patient has                            taken no previous anticoagulant or antiplatelet                            agents. ASA Grade Assessment: II - A patient with                            mild systemic disease. After reviewing the risks                            and benefits, the patient was deemed in                            satisfactory condition to undergo the procedure.                           After obtaining informed consent, the endoscope was  passed under direct vision. Throughout the                            procedure, the patient's blood pressure, pulse, and                            oxygen saturations were monitored continuously. The                            Endoscope was introduced through the mouth, and                            advanced to the second part of duodenum. The upper         GI endoscopy was accomplished without difficulty.                            The patient tolerated the procedure well. Scope In: Scope Out: Findings:                 Non-severe esophagitis was found.                           The cardia and gastric fundus were normal on                            retroflexion.                           Mild inflammation characterized by erythema,                            friability and granularity was found in the gastric                            antrum. Biopsies were taken with a cold forceps for                            Helicobacter pylori testing using CLOtest.                           The exam of the stomach was otherwise normal.                           Patchy mildly erythematous mucosa was found in the                            duodenal bulb.                           The exam of the duodenum was otherwise normal. Complications:            No immediate complications. Estimated Blood Loss:     Estimated blood loss: none. Impression:               - Non-severe reflux esophagitis.                           -  Gastritis. Biopsied.                           - Erythematous duodenopathy. Recommendation:           - Prescribed pantoprazole 40 mg daily; #30; 6                            refills.                           - Resume previous diet.                           - Continue present medications.                           - Await pathology results. Treat for Helicobacter                            pylori if CLO positive.                           - Office follow-up with Dr. Henrene Pastor in about 6-8 weeks Docia Chuck. Henrene Pastor, MD 09/27/2016 9:33:46 AM This report has been signed electronically.

## 2016-09-29 ENCOUNTER — Telehealth: Payer: Self-pay | Admitting: *Deleted

## 2016-09-29 ENCOUNTER — Telehealth: Payer: Self-pay

## 2016-09-29 LAB — HELICOBACTER PYLORI SCREEN-BIOPSY: UREASE: NEGATIVE

## 2016-09-29 NOTE — Telephone Encounter (Signed)
Left message on answering machine. 

## 2016-09-29 NOTE — Telephone Encounter (Signed)
  Follow up Call-  Call back number 09/27/2016  Post procedure Call Back phone  # [phone]  Permission to leave phone message Yes  Some recent data might be hidden     Patient questions:  Do you have a fever, pain , or abdominal swelling? No. Pain Score  0 *  Have you tolerated food without any problems? Yes.    Have you been able to return to your normal activities? Yes.    Do you have any questions about your discharge instructions: Diet   No. Medications  No. Follow up visit  No.  Do you have questions or concerns about your Care? No.  Actions: * If pain score is 4 or above: No action needed, pain <4.

## 2016-10-04 ENCOUNTER — Encounter: Payer: Self-pay | Admitting: Internal Medicine

## 2016-10-11 ENCOUNTER — Emergency Department (HOSPITAL_COMMUNITY)
Admission: EM | Admit: 2016-10-11 | Discharge: 2016-10-11 | Disposition: A | Discharge status: Home / Self Care | Payer: BLUE CROSS/BLUE SHIELD | Attending: Emergency Medicine | Admitting: Emergency Medicine

## 2016-10-11 ENCOUNTER — Encounter (HOSPITAL_COMMUNITY): Payer: Self-pay | Admitting: Emergency Medicine

## 2016-10-11 DIAGNOSIS — F17210 Nicotine dependence, cigarettes, uncomplicated: Secondary | ICD-10-CM | POA: Diagnosis not present

## 2016-10-11 DIAGNOSIS — Z046 Encounter for general psychiatric examination, requested by authority: Secondary | ICD-10-CM | POA: Insufficient documentation

## 2016-10-11 DIAGNOSIS — F419 Anxiety disorder, unspecified: Secondary | ICD-10-CM | POA: Diagnosis not present

## 2016-10-11 LAB — BASIC METABOLIC PANEL
ANION GAP: 8 (ref 5–15)
BUN: 21 mg/dL — ABNORMAL HIGH (ref 6–20)
CALCIUM: 9.1 mg/dL (ref 8.9–10.3)
CO2: 25 mmol/L (ref 22–32)
Chloride: 106 mmol/L (ref 101–111)
Creatinine, Ser: 0.88 mg/dL (ref 0.61–1.24)
Glucose, Bld: 109 mg/dL — ABNORMAL HIGH (ref 65–99)
Potassium: 4 mmol/L (ref 3.5–5.1)
Sodium: 139 mmol/L (ref 135–145)

## 2016-10-11 LAB — CBC WITH DIFFERENTIAL/PLATELET
BASOS ABS: 0 10*3/uL (ref 0.0–0.1)
BASOS PCT: 0 %
EOS PCT: 2 %
Eosinophils Absolute: 0.2 10*3/uL (ref 0.0–0.7)
HEMATOCRIT: 43.8 % (ref 39.0–52.0)
Hemoglobin: 14.9 g/dL (ref 13.0–17.0)
Lymphocytes Relative: 33 %
Lymphs Abs: 2.8 10*3/uL (ref 0.7–4.0)
MCH: 30.2 pg (ref 26.0–34.0)
MCHC: 34 g/dL (ref 30.0–36.0)
MCV: 88.7 fL (ref 78.0–100.0)
MONO ABS: 0.9 10*3/uL (ref 0.1–1.0)
Monocytes Relative: 11 %
NEUTROS ABS: 4.7 10*3/uL (ref 1.7–7.7)
Neutrophils Relative %: 54 %
PLATELETS: 213 10*3/uL (ref 150–400)
RBC: 4.94 MIL/uL (ref 4.22–5.81)
RDW: 13.9 % (ref 11.5–15.5)
WBC: 8.7 10*3/uL (ref 4.0–10.5)

## 2016-10-11 MED ORDER — LORAZEPAM 1 MG PO TABS: 1.0000 mg | ORAL_TABLET | Freq: Three times a day (TID) | ORAL | 0 refills | Status: DC | PRN

## 2016-10-11 MED ORDER — LORAZEPAM 0.5 MG PO TABS
0.5000 mg | ORAL_TABLET | Freq: Once | ORAL | Status: AC
  Administered 2016-10-11: 0.5 mg via ORAL
  Filled 2016-10-11: qty 1

## 2016-10-11 NOTE — Progress Notes (Signed)
HPI:   Todd.Todd Choi is a 56 y.o. male, who is here today to follow on recent ER visit.   He was seen in the ER on 10/10/16 (yesterday) because acute anxiety. Hx of depression and anxiety. He was c/o shortness of breath and insomnia.   Lab Results  Component Value Date   WBC 8.7 10/11/2016   HGB 14.9 10/11/2016   HCT 43.8 10/11/2016   MCV 88.7 10/11/2016   PLT 213 10/11/2016   Lab Results  Component Value Date   CREATININE 0.88 10/11/2016   BUN 21 (H) 10/11/2016   NA 139 10/11/2016   K 4.0 10/11/2016   CL 106 10/11/2016   CO2 25 10/11/2016   EKG: SR, no acute ischemia.   He was given Ativan 0.5 mg in the ER and discharged on Ativan 1 mg tid. He took 3 Ativan yesterday because has not been able to sleep and did not help much with anxiety, slept better, 3-4 hours.  I last saw Todd Choi on 08/12/16, he has prior Hx of hepatitis C and past Hx of substance abuse. Hx of insomnia, usually he wakes up a few times per night but for the past 1-2 weeks he is sleeping max an hour.    Last OV he reported that his anxiety was better, following a "spiritual program." He was on Lexapro in the past. He is not sure about trigger factors, he thinks the fact that his ex wife is selling the house he and her built together when they were married is causing some sadness. He feels like this house, which now belongs to his ex wife, was the last thing he built and "did not destroy" due to his drug problems. He has a good relation with his ex wife, "best friends", he has lived with her for years but she decided to sell the house.  Anxiety is not affecting his job yet and he feels better while he is at work.  Usually symptoms are at night when he is in bed, feels like he can not breath, fear,and nervous. He denies suicidal thoughts. + Sadness.  Wellbutrin was recommended after his last OV for smoking cassation but he has not started it yet.  Hx of GERD, Protonix has helped with  symptoms. He does not have heartburn or acid reflux.    Review of Systems  Constitutional: Positive for fatigue. Negative for activity change, appetite change, fever and unexpected weight change.  HENT: Negative for trouble swallowing.   Eyes: Negative for visual disturbance.  Respiratory: Positive for shortness of breath. Negative for cough and wheezing.   Cardiovascular: Negative for chest pain, palpitations and leg swelling.  Gastrointestinal: Negative for abdominal pain, diarrhea, nausea and vomiting.  Neurological: Negative for tremors, syncope, weakness and headaches.  Psychiatric/Behavioral: Positive for sleep disturbance. Negative for agitation, confusion, hallucinations and suicidal ideas. The patient is nervous/anxious.       Current Outpatient Prescriptions on File Prior to Visit  Medication Sig Dispense Refill  . LORazepam (ATIVAN) 1 MG tablet Take 1 tablet (1 mg total) by mouth 3 (three) times daily as needed for anxiety. 9 tablet 0  . pantoprazole (PROTONIX) 40 MG tablet Take 1 tablet (40 mg total) by mouth daily. 30 tablet 6   Current Facility-Administered Medications on File Prior to Visit  Medication Dose Route Frequency Provider Last Rate Last Dose  . 0.9 %  sodium chloride infusion  500 mL Intravenous Continuous Irene Shipper, MD  Past Medical History:  Diagnosis Date  . Depression   . Hepatitis    HEP C     WITH TREATMENT  . History of drug abuse    IN RECOVERY  . Sleep apnea    does not wear a CPAP   No Known Allergies  Social History   Social History  . Marital status: Single    Spouse name: N/A  . Number of children: N/A  . Years of education: N/A   Social History Main Topics  . Smoking status: Current Every Day Smoker    Years: 35.00    Types: Cigarettes  . Smokeless tobacco: Never Used     Comment: about to start Welbutrin to quit completely  . Alcohol use No     Comment: HISTORY OF   . Drug use: Yes    Types: Cocaine  . Sexual  activity: Yes   Other Topics Concern  . None   Social History Narrative  . None    Vitals:   10/12/16 0819  BP: 118/70  Pulse: 87  Resp: 12  O2 sat at RA 96% Body mass index is 25.29 kg/m.   Physical Exam  Nursing note and vitals reviewed. Constitutional: He is oriented to person, place, and time. He appears well-developed. No distress.  HENT:  Head: Normocephalic and atraumatic.  Mouth/Throat: Oropharynx is clear and moist and mucous membranes are normal.  Eyes: Conjunctivae are normal.  Cardiovascular: Normal rate and regular rhythm.   No murmur heard. Respiratory: Effort normal and breath sounds normal. No respiratory distress.  Musculoskeletal: He exhibits no edema.  Neurological: He is alert and oriented to person, place, and time. He has normal strength. Gait normal.  Skin: Skin is warm. No erythema.  Psychiatric: His mood appears anxious. Cognition and memory are normal. He expresses no suicidal ideation. He expresses no suicidal plans.  Poor groomed, good eye contact.      ASSESSMENT AND PLAN:   Todd Choi was seen today for hospitalization follow-up.  Diagnoses and all orders for this visit:  Insomnia, unspecified type  Good sleep hygiene recommended. Hydroxyzine may help. We may consider Seroquel or Remeron if needed.  Generalized anxiety disorder with panic attacks  After discussion of treatment options and some side effects he agrees with trying Lexapro, which he tolerated well in the past. We will hold on Wellbutrin for now. I do not recommend taking Ativan, some side effects discussed. Hydroxyzine can be taken at bedtime and as needed for anxiety episodes. He has psychotherapist and will arrange appt. Instructed about warning signs. F/U in 4 weeks, before if needed.   -     escitalopram (LEXAPRO) 10 MG tablet; Take 1 tablet (10 mg total) by mouth daily. -     hydrOXYzine (ATARAX/VISTARIL) 25 MG tablet; Take 1 tablet (25 mg total) by mouth 3  (three) times daily as needed for itching.   18 min face to face OV. > 50% was dedicated to discussion of Dx , treatment options, and some side effects of medications. He has a therapists he can see, he is not interested in a referral to behavioral therapy through [facility].   Yasira Engelson G. [place], MD  [facility]. [place] office.

## 2016-10-11 NOTE — Discharge Instructions (Signed)
Take your medication as prescribed. I recommend trying some of the stress relieving techniques we discussed today to help with your anxiety. Follow up with your primary care provider in 2-3 days for follow up and further management. Please return to the Emergency Department if symptoms worsen or new onset of fever, chest pain, difficulty breathing, cough, vomiting, abdominal pain, numbness, weakness, syncope.
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
## 2016-10-11 NOTE — ED Triage Notes (Signed)
Pt reports feeling like he is having panic attacks for the last 4 nights and has been unable to sleep.

## 2016-10-12 ENCOUNTER — Encounter: Payer: Self-pay | Admitting: Family Medicine

## 2016-10-12 ENCOUNTER — Ambulatory Visit (INDEPENDENT_AMBULATORY_CARE_PROVIDER_SITE_OTHER): Payer: BLUE CROSS/BLUE SHIELD | Admitting: Family Medicine

## 2016-10-12 VITALS — BP 118/70 | HR 87 | Resp 12 | Ht 72.0 in | Wt 186.5 lb

## 2016-10-12 DIAGNOSIS — G4700 Insomnia, unspecified: Secondary | ICD-10-CM

## 2016-10-12 DIAGNOSIS — F411 Generalized anxiety disorder: Secondary | ICD-10-CM | POA: Insufficient documentation

## 2016-10-12 DIAGNOSIS — F410 Panic disorder [episodic paroxysmal anxiety] without agoraphobia: Secondary | ICD-10-CM | POA: Diagnosis not present

## 2016-10-12 MED ORDER — HYDROXYZINE HCL 25 MG PO TABS: 25.0000 mg | ORAL_TABLET | Freq: Three times a day (TID) | ORAL | 1 refills | Status: DC | PRN

## 2016-10-12 MED ORDER — ESCITALOPRAM OXALATE 10 MG PO TABS: 10.0000 mg | ORAL_TABLET | Freq: Every day | ORAL | 1 refills | Status: DC

## 2016-10-12 NOTE — Patient Instructions (Signed)
A few things to remember from today's visit:   Insomnia, unspecified type  Generalized anxiety disorder with panic attacks - Plan: escitalopram (LEXAPRO) 10 MG tablet, hydrOXYzine (ATARAX/VISTARIL) 25 MG tablet  Today we started Lexapro, this type of medications can increase suicidal risk. This is more prevalent among children,adolecents, and young adults with major depression or other psychiatric disorders. It can also make depression worse. Most common side effects are gastrointestinal, self limited after a few weeks: diarrhea, nausea, constipation  Or diarrhea among some.   Psychotherapy also may help.  In general it is well tolerated. We will follow closely.      Please be sure medication list is accurate. If a new problem present, please set up appointment sooner than planned today.

## 2016-10-19 ENCOUNTER — Encounter: Payer: BLUE CROSS/BLUE SHIELD | Admitting: Internal Medicine

## 2016-11-16 ENCOUNTER — Ambulatory Visit: Payer: BLUE CROSS/BLUE SHIELD | Admitting: Internal Medicine

## 2016-11-30 ENCOUNTER — Encounter: Payer: Self-pay | Admitting: Family Medicine

## 2016-11-30 ENCOUNTER — Ambulatory Visit (INDEPENDENT_AMBULATORY_CARE_PROVIDER_SITE_OTHER): Payer: Worker's Compensation | Admitting: Family Medicine

## 2016-11-30 VITALS — BP 128/78

## 2016-11-30 DIAGNOSIS — Z23 Encounter for immunization: Secondary | ICD-10-CM

## 2016-11-30 DIAGNOSIS — S61209A Unspecified open wound of unspecified finger without damage to nail, initial encounter: Secondary | ICD-10-CM | POA: Diagnosis not present

## 2016-11-30 MED ORDER — CEPHALEXIN 500 MG PO CAPS: 500.0000 mg | ORAL_CAPSULE | Freq: Three times a day (TID) | ORAL | 0 refills | Status: DC
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
## 2016-11-30 NOTE — Patient Instructions (Addendum)
Elevate hand frequently  Avoid aspirin for the next few days.

## 2016-12-01 NOTE — Progress Notes (Signed)
HPI:   Todd Choi is a 56 y.o. male, who is here today to follow on recent acute OV.   He was seen on 11/30/16 after right 5th finger injury while he was cutting cabbage, this happened at home. Cut through part of his finger and fingernail partially involved, did not require sutures.  C/O severe pain, he took Mobic from his ex wife Rx and helped some. OTC Ibuprofen helped little. Throbbing constant pain, exacerbated by palpation/movement. He has had intermittent oozing, pressure helps to stop bleeding. He is keeping wound covered with gauze.  Taking Keflex as recommended.  Today he woke up with mild nausea and body aches, attributes these to antibiotics.  I last saw Todd Choi on 10/12/16. Today he is also reporting panic attacks, "not bad", mildly better with Lexapro. Last OV he was started on Lexapro, which he discontinued after 4-5 days because decreased libido and resume 2 days ago. He is not sleeping well, known Hx of insomnia, Melatonin does not help. Hydroxyzine helps some with anxiety.  She denies suicidal thoughts.    Review of Systems  Constitutional: Positive for fatigue. Negative for appetite change, chills and fever.  HENT: Negative for mouth sores, sore throat and trouble swallowing.   Eyes: Negative for redness and visual disturbance.  Respiratory: Negative for shortness of breath and wheezing.   Cardiovascular: Negative for chest pain, palpitations and leg swelling.  Gastrointestinal: Positive for nausea. Negative for abdominal pain and vomiting.  Genitourinary: Negative for decreased urine volume, dysuria and hematuria.  Musculoskeletal: Positive for myalgias. Negative for gait problem.  Skin: Positive for wound. Negative for rash.  Neurological: Negative for syncope, weakness and headaches.  Psychiatric/Behavioral: Positive for sleep disturbance. Negative for confusion, hallucinations and suicidal ideas. The patient is nervous/anxious.        Current Outpatient Prescriptions on File Prior to Visit  Medication Sig Dispense Refill  . cephALEXin (KEFLEX) 500 MG capsule Take 1 capsule (500 mg total) by mouth 3 (three) times daily. 21 capsule 0  . escitalopram (LEXAPRO) 10 MG tablet Take 1 tablet (10 mg total) by mouth daily. 30 tablet 1  . hydrOXYzine (ATARAX/VISTARIL) 25 MG tablet Take 1 tablet (25 mg total) by mouth 3 (three) times daily as needed for itching. 30 tablet 1  . LORazepam (ATIVAN) 1 MG tablet Take 1 tablet (1 mg total) by mouth 3 (three) times daily as needed for anxiety. 9 tablet 0  . pantoprazole (PROTONIX) 40 MG tablet Take 1 tablet (40 mg total) by mouth daily. 30 tablet 6   Current Facility-Administered Medications on File Prior to Visit  Medication Dose Route Frequency Provider Last Rate Last Dose  . 0.9 %  sodium chloride infusion  500 mL Intravenous Continuous Irene Shipper, MD         Past Medical History:  Diagnosis Date  . Depression   . Hepatitis    HEP C     WITH TREATMENT  . History of drug abuse    IN RECOVERY  . Sleep apnea    does not wear a CPAP   No Known Allergies  Social History   Social History  . Marital status: Single    Spouse name: N/A  . Number of children: N/A  . Years of education: N/A   Social History Main Topics  . Smoking status: Current Every Day Smoker    Years: 35.00    Types: Cigarettes  . Smokeless tobacco: Never Used  Comment: about to start Welbutrin to quit completely  . Alcohol use No     Comment: HISTORY OF   . Drug use: Yes    Types: Cocaine  . Sexual activity: Yes   Other Topics Concern  . None   Social History Narrative  . None    Vitals:   12/02/16 0701  BP: 122/80  Pulse: 85  Resp: 12  SpO2: 95%   Body mass index is 25.69 kg/m.   Physical Exam  Nursing note and vitals reviewed. Constitutional: He is oriented to person, place, and time. He appears well-developed and well-nourished. No distress.  HENT:  Head:  Normocephalic and atraumatic.  Mouth/Throat: Oropharynx is clear and moist and mucous membranes are normal.  Eyes: Conjunctivae are normal.  Cardiovascular: Normal rate and regular rhythm.   No murmur heard. Pulses:      Radial pulses are 2+ on the right side.  Normal capillary refill.  Respiratory: Effort normal and breath sounds normal. No respiratory distress.  GI: Soft. He exhibits no mass. There is no hepatomegaly. There is no tenderness.  Musculoskeletal: He exhibits no edema.       Right hand: He exhibits tenderness. He exhibits normal capillary refill.       Hands: Cleaned wound medial/distal aspect of phalange. Medial aspect of fingernail also lost.Mild edema. No local heat or active bleeding and there is no bone exposed.   Neurological: He is alert and oriented to person, place, and time.  Skin: Skin is warm. No rash noted.  Psychiatric: His mood appears anxious. Cognition and memory are normal. He expresses no suicidal ideation.  Fairly groomed, good eye contact.      ASSESSMENT AND PLAN:   Todd. Choi was seen today for follow-up.  Diagnoses and all orders for this visit:  Avulsion of finger tip, subsequent encounter  No signs of infection. Keep wound uncovered for periods of time. Wash with soap and water daily. Elevation, complete abx treatment. Monitor for signs of complications/infection. He is a Training and development officer, so note for work given. F/U in 3-4 weeks,before if needed.  -     meloxicam (MOBIC) 15 MG tablet; Take 1 tablet (15 mg total) by mouth daily.  Insomnia, unspecified type  Good sleep hygiene discussed. After discussion of some treatment options, he agrees with trying Doxepin 10 mg. Some side effects discussed. F/U in 3 weeks.  -     doxepin (SINEQUAN) 10 MG capsule; Take 1 capsule (10 mg total) by mouth at bedtime.  Generalized anxiety disorder with panic attacks  Still symptomatic. Resumed Lexapro 10 mg 2 days ago. We discussed some side effects,  including changes in libido. Other options discussed, Wellbutrin. He would like to continue Lexapro for now. Instructed about warning signs. F/U in 3-4 weeks,befire if needed.     28 min face to face OV. > 50% was dedicated to discussion of above Dx, prognosis, treatment options, and side effects of medications.Abx can certainly be causing nausea, other possible etiologies discussed, monitor for new symptoms. Explained that tobacco may delay healing, encouraged to quit.   Sheritta Deeg G. Martinique, MD  [facility]. [place] office.

## 2016-12-02 ENCOUNTER — Encounter: Payer: Self-pay | Admitting: Family Medicine

## 2016-12-02 ENCOUNTER — Ambulatory Visit (INDEPENDENT_AMBULATORY_CARE_PROVIDER_SITE_OTHER): Payer: BLUE CROSS/BLUE SHIELD | Admitting: Family Medicine

## 2016-12-02 VITALS — BP 122/80 | HR 85 | Resp 12 | Ht 72.0 in | Wt 189.4 lb

## 2016-12-02 DIAGNOSIS — S61209D Unspecified open wound of unspecified finger without damage to nail, subsequent encounter: Secondary | ICD-10-CM | POA: Diagnosis not present

## 2016-12-02 DIAGNOSIS — F410 Panic disorder [episodic paroxysmal anxiety] without agoraphobia: Secondary | ICD-10-CM | POA: Diagnosis not present

## 2016-12-02 DIAGNOSIS — F411 Generalized anxiety disorder: Secondary | ICD-10-CM | POA: Diagnosis not present

## 2016-12-02 DIAGNOSIS — G4700 Insomnia, unspecified: Secondary | ICD-10-CM

## 2016-12-02 MED ORDER — MELOXICAM 15 MG PO TABS: 15.0000 mg | ORAL_TABLET | Freq: Every day | ORAL | 0 refills | Status: AC

## 2016-12-02 MED ORDER — DOXEPIN HCL 10 MG PO CAPS: 10.0000 mg | ORAL_CAPSULE | Freq: Every day | ORAL | 1 refills | Status: DC

## 2016-12-02 NOTE — Patient Instructions (Signed)
A few things to remember from today's visit:   Laceration of right little finger without foreign body with damage to nail, subsequent encounter - Plan: meloxicam (MOBIC) 15 MG tablet  Insomnia, unspecified type - Plan: doxepin (SINEQUAN) 10 MG capsule   Please be sure medication list is accurate. If a new problem present, please set up appointment sooner than planned today.

## 2016-12-05 ENCOUNTER — Telehealth: Payer: Self-pay | Admitting: *Deleted

## 2016-12-05 NOTE — Telephone Encounter (Signed)
CC: Dressing stuck to wound, unable to remove. Requesting dressing change.   Patient seen on Friday 12/02/16 for R finger avulsion. Wound was dressed w/ gauze and coban. He attempted to remove dressing at home but was unable to do so because the gauze was stuck to the wound. He has been taking atbx as prescribed and has no side effects.  Excess old dressing removed and finger soaked in warm water. Remaining gauze then removed. Patient tolerated well. Wound bed pink and excoriated w/ scant serosanguineous drainage. Wound margins irregular and pink. No sign of infection.   Wound redressed with triple antibiotic ointment on wound bed, covered with non adherent gauze, and then wrapped with rolled gauzed and coban.   Patient instructed per last AVS:  Keep wound uncovered for periods of time. Wash with soap and water daily. Elevation, complete abx treatmeKnt. Monitor for signs of complications/infection.  Patient verbalized understanding of instructions. Nothing further needed at this time.

## 2017-08-24 ENCOUNTER — Ambulatory Visit: Payer: BLUE CROSS/BLUE SHIELD | Admitting: Family Medicine

## 2017-08-24 ENCOUNTER — Encounter: Payer: Self-pay | Admitting: Family Medicine

## 2017-08-24 VITALS — BP 124/83 | HR 80 | Temp 98.1°F | Resp 12 | Ht 72.0 in | Wt 179.1 lb

## 2017-08-24 DIAGNOSIS — M25542 Pain in joints of left hand: Secondary | ICD-10-CM

## 2017-08-24 DIAGNOSIS — L03114 Cellulitis of left upper limb: Secondary | ICD-10-CM

## 2017-08-24 MED ORDER — DOXYCYCLINE HYCLATE 100 MG PO TABS: 100.0000 mg | ORAL_TABLET | Freq: Two times a day (BID) | ORAL | 0 refills | Status: AC

## 2017-08-24 NOTE — Patient Instructions (Signed)
A few things to remember from today's visit:   Cellulitis of left upper extremity - Plan: doxycycline (VIBRA-TABS) 100 MG tablet   Please be sure medication list is accurate. If a new problem present, please set up appointment sooner than planned today.

## 2017-08-24 NOTE — Progress Notes (Addendum)
ACUTE VISIT   HPI:  Chief Complaint  Patient presents with  . Right hand pain    started about 1 1/2 months ago, possible infection, joint pain    [name] is a 57 y.o. male, who is here today complaining of 1-2 months of tender MCP joint left hand. Pain is mild,intermittent,exacerbated by movement and alleviated by rest.  He has past history of osteomyelitis, he is afraid that he may have an infection going on at this time. He does not recall trauma but I noticed some skin lesions on left hand and forearms.  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  He has not noted chills, fever, unusual fatigue, or joint swelling. No limitation of movement.  He has been applying OTC antibiotic ointment on the skin lesions.Also Ibuprofen prn.  Problem has been stable.    Review of Systems  Constitutional: Positive for fatigue. Negative for chills and fever.  Respiratory: Negative for shortness of breath and wheezing.   Gastrointestinal: Negative for nausea and vomiting.  Musculoskeletal: Positive for arthralgias. Negative for joint swelling.  Skin: Positive for rash and wound.  Neurological: Negative for weakness and headaches.  Hematological: Negative for adenopathy. Does not bruise/bleed easily.  Psychiatric/Behavioral: Negative for confusion. The patient is nervous/anxious.       No current outpatient medications on file prior to visit.   Current Facility-Administered Medications on File Prior to Visit  Medication Dose Route Frequency Provider Last Rate Last Dose  . 0.9 %  sodium chloride infusion  500 mL Intravenous Continuous Irene Shipper, MD         Past Medical History:  Diagnosis Date  . Depression   . Hepatitis    HEP C     WITH TREATMENT  . History of drug abuse    IN RECOVERY  . Sleep apnea    does not wear a CPAP   No Known Allergies  Social History   Socioeconomic History  . Marital status: Single    Spouse name: Not on file  . Number of children: Not on file  . Years of education: Not on file  . Highest education level: Not on file  Occupational History  . Not on file  Social Needs  . Financial resource strain: Not on file  . Food insecurity:    Worry: Not on file    Inability: Not on file  . Transportation needs:    Medical: Not on file    Non-medical: Not on file  Tobacco Use  . Smoking status: Current Every Day Smoker    Years: 35.00    Types: Cigarettes  . Smokeless tobacco: Never Used  . Tobacco comment: about to start Welbutrin to quit completely  Substance and Sexual Activity  . Alcohol use: No    Comment: HISTORY OF   . Drug use: Yes    Types: Cocaine  . Sexual activity: Yes  Lifestyle  . Physical activity:    Days per week: Not on file    Minutes per session: Not on file  . Stress: Not on file  Relationships  . Social connections:    Talks on phone: Not on file    Gets together: Not on file    Attends religious service: Not on file    Active member of club or  organization: Not on file    Attends meetings of clubs or organizations: Not on file    Relationship status: Not on file  Other Topics Concern  . Not on file  Social History Narrative  . Not on file    Vitals:   08/24/17 1616  BP: 124/83  Pulse: 80  Resp: 12  Temp: 98.1 F (36.7 C)  SpO2: 97%   Body mass index is 24.29 kg/m.    Physical Exam  Nursing note and vitals reviewed. Constitutional: He is oriented to person, place, and time. He appears well-developed and well-nourished. He does not appear ill. No distress.  HENT:  Head: Normocephalic and atraumatic.  Mouth/Throat: Oropharynx is clear and moist and mucous membranes are normal.  Eyes: Conjunctivae are normal.  Cardiovascular: Normal rate and regular rhythm.  Pulses:      Radial pulses are 2+ on the left  side.  Respiratory: Effort normal and breath sounds normal. No respiratory distress.  Musculoskeletal: He exhibits no edema.       Hands: Mild tenderness upon palpation of third and fourth MCP left hand. No signs of synovitis. No limitation of ROM.  Neurological: He is alert and oriented to person, place, and time. He has normal strength. Gait normal.  Skin: Skin is warm. Rash noted. No erythema.  Scattered healing lesions on upper extremities bilateral. Left hand: Dorsal in between second and third MCP there is a round erythematosus lesion 2.5 cm, with some superficial excoriations and crusted areas.  Mildly hot upon palpation and tender.  No induration or fluctuant area.  Psychiatric: His mood appears anxious.  Well groomed, good eye contact.      ASSESSMENT AND PLAN:   [name] was seen today for right hand pain.  Diagnoses and all orders for this visit:  Arthralgia of left hand  Possible causes discussed, could be OA. I do not think imaging is needed at this time but will be considered if skin lesion doe snot resolve in 2 weeks.  Cellulitis of left upper extremity  Mild. Recommend oral abx. Side effects of Doxycycline discussed. Continue topical abx oint. Monitor for worsening signs. Instructed about warning signs.  -     doxycycline (VIBRA-TABS) 100 MG tablet; Take 1 tablet (100 mg total) by mouth 2 (two) times daily for 7 days.     Return if symptoms worsen or fail to improve.     Takari Lundahl G. Martinique, MD  [facility]. [place] office.

## 2017-10-02 ENCOUNTER — Encounter: Payer: Self-pay | Admitting: Internal Medicine

## 2017-10-02 ENCOUNTER — Ambulatory Visit: Payer: BLUE CROSS/BLUE SHIELD | Admitting: Internal Medicine

## 2017-10-02 VITALS — BP 126/80 | HR 74 | Ht 66.0 in | Wt 184.0 lb

## 2017-10-02 DIAGNOSIS — K219 Gastro-esophageal reflux disease without esophagitis: Secondary | ICD-10-CM

## 2017-10-02 DIAGNOSIS — Z86010 Personal history of colonic polyps: Secondary | ICD-10-CM

## 2017-10-02 DIAGNOSIS — K8020 Calculus of gallbladder without cholecystitis without obstruction: Secondary | ICD-10-CM

## 2017-10-02 DIAGNOSIS — R140 Abdominal distension (gaseous): Secondary | ICD-10-CM

## 2017-10-02 DIAGNOSIS — Z720 Tobacco use: Secondary | ICD-10-CM

## 2017-10-02 MED ORDER — METRONIDAZOLE 250 MG PO TABS: 250.0000 mg | ORAL_TABLET | Freq: Three times a day (TID) | ORAL | 0 refills | Status: DC

## 2017-10-02 MED ORDER — OMEPRAZOLE 40 MG PO CPDR: 40.0000 mg | DELAYED_RELEASE_CAPSULE | Freq: Every day | ORAL | 3 refills | Status: DC
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
## 2017-10-02 NOTE — Patient Instructions (Signed)
Your provider has requested that you go to the basement level for lab work before leaving today. Press "B" on the elevator. The lab is located at the first door on the left as you exit the elevator.  We have sent the following medications to your pharmacy for you to pick up at your convenience: Flagyl, Omeprazole  Please follow up in 3 months

## 2018-07-14 ENCOUNTER — Encounter (HOSPITAL_COMMUNITY): Payer: Self-pay | Admitting: *Deleted

## 2018-07-14 ENCOUNTER — Emergency Department (HOSPITAL_COMMUNITY): Payer: BLUE CROSS/BLUE SHIELD

## 2018-07-14 ENCOUNTER — Other Ambulatory Visit: Payer: Self-pay

## 2018-07-14 ENCOUNTER — Ambulatory Visit (HOSPITAL_COMMUNITY)
Admission: EM | Admit: 2018-07-14 | Discharge: 2018-07-15 | Disposition: A | Discharge status: Home / Self Care | Payer: BLUE CROSS/BLUE SHIELD | Attending: General Surgery | Admitting: General Surgery

## 2018-07-14 DIAGNOSIS — K8010 Calculus of gallbladder with chronic cholecystitis without obstruction: Secondary | ICD-10-CM | POA: Diagnosis not present

## 2018-07-14 DIAGNOSIS — R1011 Right upper quadrant pain: Secondary | ICD-10-CM

## 2018-07-14 DIAGNOSIS — F17210 Nicotine dependence, cigarettes, uncomplicated: Secondary | ICD-10-CM | POA: Diagnosis not present

## 2018-07-14 DIAGNOSIS — M1990 Unspecified osteoarthritis, unspecified site: Secondary | ICD-10-CM | POA: Insufficient documentation

## 2018-07-14 DIAGNOSIS — K219 Gastro-esophageal reflux disease without esophagitis: Secondary | ICD-10-CM | POA: Diagnosis not present

## 2018-07-14 DIAGNOSIS — G4730 Sleep apnea, unspecified: Secondary | ICD-10-CM | POA: Insufficient documentation

## 2018-07-14 DIAGNOSIS — Z791 Long term (current) use of non-steroidal anti-inflammatories (NSAID): Secondary | ICD-10-CM | POA: Insufficient documentation

## 2018-07-14 DIAGNOSIS — Z79899 Other long term (current) drug therapy: Secondary | ICD-10-CM | POA: Diagnosis not present

## 2018-07-14 DIAGNOSIS — B1920 Unspecified viral hepatitis C without hepatic coma: Secondary | ICD-10-CM | POA: Insufficient documentation

## 2018-07-14 DIAGNOSIS — K8020 Calculus of gallbladder without cholecystitis without obstruction: Secondary | ICD-10-CM

## 2018-07-14 DIAGNOSIS — K810 Acute cholecystitis: Secondary | ICD-10-CM | POA: Diagnosis present

## 2018-07-14 LAB — CBC WITH DIFFERENTIAL/PLATELET
Abs Immature Granulocytes: 0.04 10*3/uL (ref 0.00–0.07)
Basophils Absolute: 0.1 10*3/uL (ref 0.0–0.1)
Basophils Relative: 1 %
Eosinophils Absolute: 0.2 10*3/uL (ref 0.0–0.5)
Eosinophils Relative: 2 %
HCT: 49.1 % (ref 39.0–52.0)
Hemoglobin: 16.7 g/dL (ref 13.0–17.0)
Immature Granulocytes: 0 %
Lymphocytes Relative: 24 %
Lymphs Abs: 2.7 10*3/uL (ref 0.7–4.0)
MCH: 30.8 pg (ref 26.0–34.0)
MCHC: 34 g/dL (ref 30.0–36.0)
MCV: 90.6 fL (ref 80.0–100.0)
Monocytes Absolute: 0.9 10*3/uL (ref 0.1–1.0)
Monocytes Relative: 8 %
Neutro Abs: 7.3 10*3/uL (ref 1.7–7.7)
Neutrophils Relative %: 65 %
Platelets: 254 10*3/uL (ref 150–400)
RBC: 5.42 MIL/uL (ref 4.22–5.81)
RDW: 13.1 % (ref 11.5–15.5)
WBC: 11.3 10*3/uL — ABNORMAL HIGH (ref 4.0–10.5)
nRBC: 0 % (ref 0.0–0.2)

## 2018-07-14 LAB — URINALYSIS, ROUTINE W REFLEX MICROSCOPIC
Bacteria, UA: NONE SEEN
Bilirubin Urine: NEGATIVE
Glucose, UA: NEGATIVE mg/dL
Hgb urine dipstick: NEGATIVE
Ketones, ur: 5 mg/dL — AB
Nitrite: NEGATIVE
Protein, ur: NEGATIVE mg/dL
Specific Gravity, Urine: 1.016 (ref 1.005–1.030)
pH: 8 (ref 5.0–8.0)

## 2018-07-14 LAB — COMPREHENSIVE METABOLIC PANEL
ALT: 23 U/L (ref 0–44)
AST: 22 U/L (ref 15–41)
Albumin: 4.7 g/dL (ref 3.5–5.0)
Alkaline Phosphatase: 54 U/L (ref 38–126)
Anion gap: 11 (ref 5–15)
BUN: 21 mg/dL — ABNORMAL HIGH (ref 6–20)
CO2: 24 mmol/L (ref 22–32)
Calcium: 9.5 mg/dL (ref 8.9–10.3)
Chloride: 103 mmol/L (ref 98–111)
Creatinine, Ser: 0.75 mg/dL (ref 0.61–1.24)
GFR calc Af Amer: >60 mL/min (ref >60)
GFR calc non Af Amer: >60 mL/min (ref >60)
Glucose, Bld: 115 mg/dL — ABNORMAL HIGH (ref 70–99)
Potassium: 4 mmol/L (ref 3.5–5.1)
Sodium: 138 mmol/L (ref 135–145)
Total Bilirubin: 0.5 mg/dL (ref 0.3–1.2)
Total Protein: 7.8 g/dL (ref 6.5–8.1)

## 2018-07-14 LAB — RAPID URINE DRUG SCREEN, HOSP PERFORMED
Amphetamines: NOT DETECTED
Barbiturates: NOT DETECTED
Benzodiazepines: NOT DETECTED
Cocaine: POSITIVE — AB
Opiates: NOT DETECTED
Tetrahydrocannabinol: NOT DETECTED

## 2018-07-14 LAB — LIPASE, BLOOD: Lipase: 41 U/L (ref 11–51)

## 2018-07-14 MED ORDER — HYDROMORPHONE HCL 1 MG/ML IJ SOLN
1.0000 mg | INTRAMUSCULAR | Status: DC | PRN
  Filled 2018-07-14: qty 1

## 2018-07-14 MED ORDER — SODIUM CHLORIDE 0.9 % IV SOLN
2.0000 g | Freq: Once | INTRAVENOUS | Status: AC
  Administered 2018-07-14: 2 g via INTRAVENOUS
  Filled 2018-07-14: qty 20

## 2018-07-14 MED ORDER — LACTATED RINGERS IV SOLN
INTRAVENOUS | Status: DC
  Administered 2018-07-14: via INTRAVENOUS

## 2018-07-14 MED ORDER — ONDANSETRON HCL 4 MG/2ML IJ SOLN
4.0000 mg | Freq: Once | INTRAMUSCULAR | Status: AC
  Administered 2018-07-14: 22:00:00 4 mg via INTRAVENOUS
  Filled 2018-07-14: qty 2

## 2018-07-14 MED ORDER — HYDROMORPHONE HCL 1 MG/ML IJ SOLN
1.0000 mg | Freq: Once | INTRAMUSCULAR | Status: AC
  Administered 2018-07-14: 1 mg via INTRAVENOUS
  Filled 2018-07-14: qty 1

## 2018-07-14 MED ORDER — MIDAZOLAM HCL 2 MG/2ML IJ SOLN
1.0000 mg | Freq: Once | INTRAMUSCULAR | Status: AC
  Administered 2018-07-14: 1 mg via INTRAVENOUS
  Filled 2018-07-14: qty 2

## 2018-07-14 MED ORDER — LACTATED RINGERS IV SOLN
INTRAVENOUS | Status: AC
  Administered 2018-07-15 (×2): via INTRAVENOUS

## 2018-07-14 MED ORDER — SODIUM CHLORIDE 0.9 % IV BOLUS
1000.0000 mL | Freq: Once | INTRAVENOUS | Status: AC
  Administered 2018-07-14: 1000 mL via INTRAVENOUS

## 2018-07-14 MED ORDER — ONDANSETRON HCL 4 MG/2ML IJ SOLN: 4.0000 mg | Freq: Three times a day (TID) | INTRAMUSCULAR | Status: DC | PRN

## 2018-07-14 NOTE — ED Notes (Signed)
Urine and urine culture sent to lab.
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
## 2018-07-14 NOTE — ED Notes (Signed)
Patient is writhing around due to pain. Patient states that pain is 10/10 despite medication administration. Will continue to monitor patient.

## 2018-07-14 NOTE — ED Notes (Addendum)
Triage RN Claiborne Billings gave verbal order for EKG to be completed. EKG completed and given to Dr. Kathrynn Humble.

## 2018-07-14 NOTE — ED Triage Notes (Signed)
Pt reports epigastric pain that started about 4 hours ago that radiates down the right side of his abdomen. Associated with n/v. He did take ibuprofen, nexium and alka selzer, without relief. No fevers.

## 2018-07-15 ENCOUNTER — Observation Stay (HOSPITAL_COMMUNITY): Payer: BLUE CROSS/BLUE SHIELD

## 2018-07-15 ENCOUNTER — Encounter (HOSPITAL_COMMUNITY): Payer: Self-pay | Admitting: Certified Registered Nurse Anesthetist

## 2018-07-15 ENCOUNTER — Observation Stay (HOSPITAL_COMMUNITY): Payer: BLUE CROSS/BLUE SHIELD | Admitting: Certified Registered Nurse Anesthetist

## 2018-07-15 ENCOUNTER — Encounter (HOSPITAL_COMMUNITY)
Admission: EM | Disposition: A | Discharge status: Home / Self Care | Payer: Self-pay | Source: Home / Self Care | Attending: Emergency Medicine

## 2018-07-15 DIAGNOSIS — K8010 Calculus of gallbladder with chronic cholecystitis without obstruction: Secondary | ICD-10-CM | POA: Diagnosis present

## 2018-07-15 HISTORY — PX: CHOLECYSTECTOMY: SHX55

## 2018-07-15 LAB — SURGICAL PCR SCREEN
MRSA, PCR: NEGATIVE
Staphylococcus aureus: NEGATIVE

## 2018-07-15 LAB — HIV ANTIBODY (ROUTINE TESTING W REFLEX): HIV Screen 4th Generation wRfx: NONREACTIVE

## 2018-07-15 SURGERY — LAPAROSCOPIC CHOLECYSTECTOMY WITH INTRAOPERATIVE CHOLANGIOGRAM
Anesthesia: General | Site: Abdomen

## 2018-07-15 MED ORDER — 0.9 % SODIUM CHLORIDE (POUR BTL) OPTIME
TOPICAL | Status: DC | PRN
  Administered 2018-07-15: 1000 mL

## 2018-07-15 MED ORDER — ROCURONIUM BROMIDE 10 MG/ML (PF) SYRINGE
PREFILLED_SYRINGE | INTRAVENOUS | Status: DC | PRN
  Administered 2018-07-15: 50 mg via INTRAVENOUS
  Administered 2018-07-15: 10 mg via INTRAVENOUS

## 2018-07-15 MED ORDER — LIDOCAINE 2% (20 MG/ML) 5 ML SYRINGE
INTRAMUSCULAR | Status: DC | PRN
  Administered 2018-07-15: 60 mg via INTRAVENOUS

## 2018-07-15 MED ORDER — HYDROMORPHONE HCL 1 MG/ML IJ SOLN
0.2500 mg | INTRAMUSCULAR | Status: DC | PRN
  Administered 2018-07-15 (×2): 0.5 mg via INTRAVENOUS

## 2018-07-15 MED ORDER — NICOTINE 14 MG/24HR TD PT24
14.0000 mg | MEDICATED_PATCH | Freq: Every day | TRANSDERMAL | Status: DC
  Administered 2018-07-15: 14 mg via TRANSDERMAL
  Filled 2018-07-15: qty 1

## 2018-07-15 MED ORDER — HYDROMORPHONE HCL 1 MG/ML IJ SOLN: 0.5000 mg | INTRAMUSCULAR | Status: DC | PRN

## 2018-07-15 MED ORDER — KCL IN DEXTROSE-NACL 20-5-0.9 MEQ/L-%-% IV SOLN
INTRAVENOUS | Status: DC
  Administered 2018-07-15: 02:00:00 via INTRAVENOUS
  Filled 2018-07-15 (×2): qty 1000

## 2018-07-15 MED ORDER — BUPIVACAINE-EPINEPHRINE 0.25% -1:200000 IJ SOLN
INTRAMUSCULAR | Status: DC | PRN
  Administered 2018-07-15: 30 mL

## 2018-07-15 MED ORDER — ONDANSETRON 4 MG PO TBDP: 4.0000 mg | ORAL_TABLET | Freq: Four times a day (QID) | ORAL | Status: DC | PRN

## 2018-07-15 MED ORDER — SUGAMMADEX SODIUM 200 MG/2ML IV SOLN
INTRAVENOUS | Status: AC
  Filled 2018-07-15: qty 4

## 2018-07-15 MED ORDER — ROCURONIUM BROMIDE 10 MG/ML (PF) SYRINGE
PREFILLED_SYRINGE | INTRAVENOUS | Status: AC
  Filled 2018-07-15: qty 10

## 2018-07-15 MED ORDER — FENTANYL CITRATE (PF) 250 MCG/5ML IJ SOLN
INTRAMUSCULAR | Status: AC
  Filled 2018-07-15: qty 5

## 2018-07-15 MED ORDER — HYDROMORPHONE HCL 1 MG/ML IJ SOLN
INTRAMUSCULAR | Status: AC
  Filled 2018-07-15: qty 2

## 2018-07-15 MED ORDER — LACTATED RINGERS IV SOLN
INTRAVENOUS | Status: AC | PRN
  Administered 2018-07-15: 1000 mL

## 2018-07-15 MED ORDER — PROPOFOL 10 MG/ML IV BOLUS
INTRAVENOUS | Status: AC
  Filled 2018-07-15: qty 40

## 2018-07-15 MED ORDER — GLYCOPYRROLATE PF 0.2 MG/ML IJ SOSY
PREFILLED_SYRINGE | INTRAMUSCULAR | Status: AC
  Filled 2018-07-15: qty 1

## 2018-07-15 MED ORDER — HEPARIN SODIUM (PORCINE) 5000 UNIT/ML IJ SOLN: 5000.0000 [IU] | Freq: Three times a day (TID) | INTRAMUSCULAR | Status: DC

## 2018-07-15 MED ORDER — SUCCINYLCHOLINE CHLORIDE 200 MG/10ML IV SOSY
PREFILLED_SYRINGE | INTRAVENOUS | Status: DC | PRN
  Administered 2018-07-15: 100 mg via INTRAVENOUS

## 2018-07-15 MED ORDER — DEXAMETHASONE SODIUM PHOSPHATE 4 MG/ML IJ SOLN
INTRAMUSCULAR | Status: DC | PRN
  Administered 2018-07-15: 4 mg via INTRAVENOUS

## 2018-07-15 MED ORDER — ONDANSETRON HCL 4 MG/2ML IJ SOLN: 4.0000 mg | Freq: Four times a day (QID) | INTRAMUSCULAR | Status: DC | PRN

## 2018-07-15 MED ORDER — HYDROMORPHONE HCL 2 MG/ML IJ SOLN
INTRAMUSCULAR | Status: AC
  Filled 2018-07-15: qty 1

## 2018-07-15 MED ORDER — BUPIVACAINE-EPINEPHRINE (PF) 0.25% -1:200000 IJ SOLN
INTRAMUSCULAR | Status: AC
  Filled 2018-07-15: qty 30

## 2018-07-15 MED ORDER — HYDROCODONE-ACETAMINOPHEN 5-325 MG PO TABS
1.0000 | ORAL_TABLET | ORAL | Status: DC | PRN
  Administered 2018-07-15: 2 via ORAL
  Filled 2018-07-15: qty 2

## 2018-07-15 MED ORDER — LORAZEPAM 2 MG/ML IJ SOLN: 1.0000 mg | Freq: Four times a day (QID) | INTRAMUSCULAR | Status: DC | PRN

## 2018-07-15 MED ORDER — MIDAZOLAM HCL 5 MG/5ML IJ SOLN
INTRAMUSCULAR | Status: DC | PRN
  Administered 2018-07-15: 2 mg via INTRAVENOUS

## 2018-07-15 MED ORDER — PROPOFOL 10 MG/ML IV BOLUS
INTRAVENOUS | Status: DC | PRN
  Administered 2018-07-15: 50 mg via INTRAVENOUS
  Administered 2018-07-15: 150 mg via INTRAVENOUS

## 2018-07-15 MED ORDER — SUGAMMADEX SODIUM 200 MG/2ML IV SOLN
INTRAVENOUS | Status: DC | PRN
  Administered 2018-07-15: 300 mg via INTRAVENOUS

## 2018-07-15 MED ORDER — FENTANYL CITRATE (PF) 100 MCG/2ML IJ SOLN
INTRAMUSCULAR | Status: DC | PRN
  Administered 2018-07-15 (×2): 50 ug via INTRAVENOUS
  Administered 2018-07-15: 100 ug via INTRAVENOUS
  Administered 2018-07-15 (×2): 50 ug via INTRAVENOUS
  Administered 2018-07-15: 100 ug via INTRAVENOUS

## 2018-07-15 MED ORDER — GLYCOPYRROLATE PF 0.2 MG/ML IJ SOSY
PREFILLED_SYRINGE | INTRAMUSCULAR | Status: DC | PRN
  Administered 2018-07-15: .2 mg via INTRAVENOUS

## 2018-07-15 MED ORDER — ONDANSETRON HCL 4 MG/2ML IJ SOLN
INTRAMUSCULAR | Status: AC
  Filled 2018-07-15: qty 2

## 2018-07-15 MED ORDER — SUCCINYLCHOLINE CHLORIDE 200 MG/10ML IV SOSY
PREFILLED_SYRINGE | INTRAVENOUS | Status: AC
  Filled 2018-07-15: qty 10

## 2018-07-15 MED ORDER — MIDAZOLAM HCL 2 MG/2ML IJ SOLN
INTRAMUSCULAR | Status: AC
  Filled 2018-07-15: qty 2

## 2018-07-15 MED ORDER — SODIUM CHLORIDE 0.9 % IV SOLN: 2.0000 g | INTRAVENOUS | Status: DC

## 2018-07-15 MED ORDER — FENTANYL CITRATE (PF) 100 MCG/2ML IJ SOLN
INTRAMUSCULAR | Status: AC
  Filled 2018-07-15: qty 2

## 2018-07-15 MED ORDER — ONDANSETRON HCL 4 MG/2ML IJ SOLN
4.0000 mg | Freq: Four times a day (QID) | INTRAMUSCULAR | Status: DC | PRN
  Administered 2018-07-15: 4 mg via INTRAVENOUS

## 2018-07-15 MED ORDER — IOHEXOL 300 MG/ML  SOLN
INTRAMUSCULAR | Status: DC | PRN
  Administered 2018-07-15: 08:00:00 50 mL

## 2018-07-15 MED ORDER — LIDOCAINE 2% (20 MG/ML) 5 ML SYRINGE
INTRAMUSCULAR | Status: AC
  Filled 2018-07-15: qty 5

## 2018-07-15 MED ORDER — PANTOPRAZOLE SODIUM 40 MG IV SOLR
40.0000 mg | Freq: Every day | INTRAVENOUS | Status: DC
  Administered 2018-07-15: 40 mg via INTRAVENOUS
  Filled 2018-07-15: qty 40

## 2018-07-15 MED ORDER — NICOTINE 14 MG/24HR TD PT24: 14.0000 mg | MEDICATED_PATCH | Freq: Every day | TRANSDERMAL | Status: DC

## 2018-07-15 MED ORDER — DEXAMETHASONE SODIUM PHOSPHATE 10 MG/ML IJ SOLN
INTRAMUSCULAR | Status: AC
  Filled 2018-07-15: qty 1

## 2018-07-15 MED ORDER — HYDROCODONE-ACETAMINOPHEN 5-325 MG PO TABS: 1.0000 | ORAL_TABLET | Freq: Four times a day (QID) | ORAL | 0 refills | Status: DC | PRN

## 2018-07-15 MED ORDER — HYDROMORPHONE HCL 1 MG/ML IJ SOLN
INTRAMUSCULAR | Status: DC | PRN
  Administered 2018-07-15 (×2): 1 mg via INTRAVENOUS

## 2018-07-15 MED ORDER — KCL IN DEXTROSE-NACL 20-5-0.9 MEQ/L-%-% IV SOLN: INTRAVENOUS | Status: DC

## 2018-07-15 MED ORDER — PANTOPRAZOLE SODIUM 40 MG IV SOLR: 40.0000 mg | Freq: Every day | INTRAVENOUS | Status: DC

## 2018-07-15 SURGICAL SUPPLY — 33 items
ADH SKN CLS APL DERMABOND .7 (GAUZE/BANDAGES/DRESSINGS) ×1
APL PRP STRL LF DISP 70% ISPRP (MISCELLANEOUS) ×1
APPLIER CLIP 5 13 M/L LIGAMAX5 (MISCELLANEOUS) ×2
APR CLP MED LRG 5 ANG JAW (MISCELLANEOUS) ×1
BAG SPEC RTRVL LRG 6X4 10 (ENDOMECHANICALS) ×1
CABLE HIGH FREQUENCY MONO STRZ (ELECTRODE) ×2 IMPLANT
CATH REDDICK CHOLANGI 4FR 50CM (CATHETERS) ×2 IMPLANT
CHLORAPREP W/TINT 26 (MISCELLANEOUS) ×2 IMPLANT
CLIP APPLIE 5 13 M/L LIGAMAX5 (MISCELLANEOUS) ×1 IMPLANT
COVER MAYO STAND STRL (DRAPES) ×2 IMPLANT
COVER WAND RF STERILE (DRAPES) IMPLANT
DECANTER SPIKE VIAL GLASS SM (MISCELLANEOUS) ×2 IMPLANT
DERMABOND ADVANCED (GAUZE/BANDAGES/DRESSINGS) ×1
DERMABOND ADVANCED .7 DNX12 (GAUZE/BANDAGES/DRESSINGS) ×1 IMPLANT
DRAPE C-ARM 42X120 X-RAY (DRAPES) ×2 IMPLANT
ELECT REM PT RETURN 15FT ADLT (MISCELLANEOUS) ×2 IMPLANT
GLOVE BIO SURGEON STRL SZ7.5 (GLOVE) ×2 IMPLANT
GOWN STRL REUS W/TWL XL LVL3 (GOWN DISPOSABLE) ×6 IMPLANT
HEMOSTAT SURGICEL 4X8 (HEMOSTASIS) IMPLANT
IV CATH 14GX2 1/4 (CATHETERS) ×2 IMPLANT
KIT BASIN OR (CUSTOM PROCEDURE TRAY) ×2 IMPLANT
KIT TURNOVER KIT A (KITS) IMPLANT
POUCH SPECIMEN RETRIEVAL 10MM (ENDOMECHANICALS) ×2 IMPLANT
SCISSORS LAP 5X35 DISP (ENDOMECHANICALS) ×2 IMPLANT
SET IRRIG TUBING LAPAROSCOPIC (IRRIGATION / IRRIGATOR) ×2 IMPLANT
SET TUBE SMOKE EVAC HIGH FLOW (TUBING) ×2 IMPLANT
SLEEVE XCEL OPT CAN 5 100 (ENDOMECHANICALS) ×4 IMPLANT
SUT MNCRL AB 4-0 PS2 18 (SUTURE) ×2 IMPLANT
TOWEL OR 17X26 10 PK STRL BLUE (TOWEL DISPOSABLE) ×2 IMPLANT
TOWEL OR NON WOVEN STRL DISP B (DISPOSABLE) ×2 IMPLANT
TRAY LAPAROSCOPIC (CUSTOM PROCEDURE TRAY) ×2 IMPLANT
TROCAR BLADELESS OPT 5 100 (ENDOMECHANICALS) ×2 IMPLANT
TROCAR XCEL BLUNT TIP 100MML (ENDOMECHANICALS) ×2 IMPLANT

## 2018-07-15 NOTE — Anesthesia Postprocedure Evaluation (Signed)
Anesthesia Post Note  Patient: Catlin Aycock  Procedure(s) Performed: LAPAROSCOPIC CHOLECYSTECTOMY WITH INTRAOPERATIVE CHOLANGIOGRAM (N/A Abdomen)     Patient location during evaluation: PACU Anesthesia Type: General Level of consciousness: awake and alert Pain management: pain level controlled Vital Signs Assessment: post-procedure vital signs reviewed and stable Respiratory status: spontaneous breathing, nonlabored ventilation and respiratory function stable Cardiovascular status: blood pressure returned to baseline and stable Postop Assessment: no apparent nausea or vomiting Anesthetic complications: no    Last Vitals:  Vitals:   07/15/18 0952 07/15/18 1100  BP: 122/75 121/75  Pulse:  77  Resp:  16  Temp: 36.7 C 36.6 C  SpO2: 98% 97%    Last Pain:  Vitals:   07/15/18 1100  TempSrc: Oral  PainSc:                  Anastassia Noack,W. EDMOND

## 2018-07-15 NOTE — Discharge Instructions (Signed)

## 2018-07-15 NOTE — Anesthesia Preprocedure Evaluation (Addendum)
Anesthesia Evaluation  Patient identified by MRN, date of birth, ID band Patient awake    Reviewed: Allergy & Precautions, H&P , NPO status , Patient's Chart, lab work & pertinent test results  Airway Mallampati: I  TM Distance: >3 FB Neck ROM: Full    Dental no notable dental hx. (+) Teeth Intact, Dental Advisory Given   Pulmonary neg pulmonary ROS, sleep apnea , Current Smoker,    Pulmonary exam normal breath sounds clear to auscultation       Cardiovascular negative cardio ROS   Rhythm:Regular Rate:Normal     Neuro/Psych Anxiety Depression negative neurological ROS     GI/Hepatic negative GI ROS, Neg liver ROS, GERD  Controlled,  Endo/Other  negative endocrine ROS  Renal/GU negative Renal ROS  negative genitourinary   Musculoskeletal  (+) Arthritis ,   Abdominal   Peds  Hematology negative hematology ROS (+)   Anesthesia Other Findings   Reproductive/Obstetrics negative OB ROS                            Anesthesia Physical Anesthesia Plan  ASA: II  Anesthesia Plan: General   Post-op Pain Management:    Induction: Intravenous  PONV Risk Score and Plan: 2 and Ondansetron, Dexamethasone and Midazolam  Airway Management Planned: Oral ETT  Additional Equipment:   Intra-op Plan:   Post-operative Plan: Extubation in OR  Informed Consent: I have reviewed the patients History and Physical, chart, labs and discussed the procedure including the risks, benefits and alternatives for the proposed anesthesia with the patient or authorized representative who has indicated his/her understanding and acceptance.     Dental advisory given  Plan Discussed with: CRNA  Anesthesia Plan Comments:         Anesthesia Quick Evaluation

## 2018-07-15 NOTE — Progress Notes (Signed)
CSW acknowledging consult for "Patient stated that he needs financial assistance in paying for his hospital expenses. Patient is also willing to get a flu vaccine if he is able to afford to receive this (financially).". CSW unable to assist with hospital billing; patient will be contacted with what he owes after his insurance is billed. This has been explained to patient, per [facility] note.   CSW signing off.  Laveda Abbe, [place] Clinical Social Worker [phone]

## 2018-07-15 NOTE — TOC Initial Note (Signed)
Transition of Care [facility]) - Initial/Assessment Note    Patient Details  Name: Todd Choi MRN: [id number] Date of Birth: [birth year]  [facility]) CM/SW Contact:    Erenest Rasher, RN Phone Number: 07/15/2018, 10:36 AM  Clinical Narrative:                 Patient had concerns about the hospital bill and being able to pay. Explained the bill will initially go to his [employer] and once he receives bill he can contact number on bill to discuss payment and assistance.   Expected Discharge Plan: Home/Self Care Barriers to Discharge: No Barriers Identified   Patient Goals and CMS Choice        Expected Discharge Plan and Services Expected Discharge Plan: Home/Self Care   Discharge Planning Services: CM Consult   Living arrangements for the past 2 months: Single Family Home                          Prior Living Arrangements/Services Living arrangements for the past 2 months: Single Family Home Lives with:: Significant Other Patient language and need for interpreter reviewed:: Yes Do you feel safe going back to the place where you live?: Yes      Need for Family Participation in Patient Care: No (Comment)     Criminal Activity/Legal Involvement Pertinent to Current Situation/Hospitalization: No - Comment as needed  Activities of Daily Living Home Assistive Devices/Equipment: None ADL Screening (condition at time of admission) Patient's cognitive ability adequate to safely complete daily activities?: No Is the patient deaf or have difficulty hearing?: Yes(Patient agrees to get hearing checked sometime this year. ) Does the patient have difficulty seeing, even when wearing glasses/contacts?: Yes Does the patient have difficulty concentrating, remembering, or making decisions?: No Patient able to express need for assistance with ADLs?: Yes Does the patient have difficulty dressing or bathing?: No Independently performs ADLs?: Yes (appropriate for  developmental age) Does the patient have difficulty walking or climbing stairs?: No Weakness of Legs: None Weakness of Arms/Hands: None  Permission Sought/Granted Permission sought to share information with : Case Manager Permission granted to share information with : Yes, Verbal Permission Granted  Share Information with NAME: Antony Madura     Permission granted to share info w Relationship: fiance     Emotional Assessment       Orientation: : Oriented to Self, Oriented to Place, Oriented to  Time, Oriented to Situation   Psych Involvement: No (comment)  Admission diagnosis:  Abdominal pain, RUQ [R10.11] Patient Active Problem List   Diagnosis Date Noted  . Cholecystitis with cholelithiasis 07/15/2018  . Acute cholecystitis 07/14/2018  . Generalized anxiety disorder with panic attacks 10/12/2016  . Insomnia 08/12/2016  . Septic arthritis of interphalangeal joint of finger ([place]) 02/28/2016  . HEPATITIS C 04/06/2010  . SMOKER 04/06/2010  . DEPRESSION 04/06/2010  . GERD 04/06/2010  . DIARRHEA, CHRONIC 04/06/2010  . ALCOHOL ABUSE, HX OF 04/06/2010  . COCAINE ABUSE, HX OF 04/06/2010   PCP:  Martinique, Betty G, MD Pharmacy:   [facility] [place] [address] Phone: [phone] Fax: [phone]  [id number] [address] [place] [address] Phone: [phone] Fax: [phone]     Social Determinants of Health (SDOH) Interventions    Readmission Risk Interventions No flowsheet data found.

## 2018-07-15 NOTE — Transfer of Care (Signed)
Immediate Anesthesia Transfer of Care Note  Patient: Todd Choi  Procedure(s) Performed: LAPAROSCOPIC CHOLECYSTECTOMY WITH INTRAOPERATIVE CHOLANGIOGRAM (N/A Abdomen)  Patient Location: PACU  Anesthesia Type:General  Level of Consciousness: awake, alert , oriented and patient cooperative  Airway & Oxygen Therapy: Patient Spontanous Breathing and Patient connected to face mask  Post-op Assessment: Report given to RN and Post -op Vital signs reviewed and stable  Post vital signs: Reviewed and stable  Last Vitals:  Vitals Value Taken Time  BP    Temp    Pulse 98 07/15/2018  9:09 AM  Resp 12 07/15/2018  9:09 AM  SpO2 100 % 07/15/2018  9:09 AM  Vitals shown include unvalidated device data.  Last Pain:  Vitals:   07/15/18 0617  TempSrc: Oral  PainSc:       Patients Stated Pain Goal: 2 (44/96/75 9163)  Complications: No apparent anesthesia complications

## 2018-07-15 NOTE — Anesthesia Procedure Notes (Signed)
Procedure Name: Intubation Date/Time: 07/15/2018 7:35 AM Performed by: Claudia Desanctis, CRNA Pre-anesthesia Checklist: Patient identified, Emergency Drugs available, Suction available and Patient being monitored Patient Re-evaluated:Patient Re-evaluated prior to induction Oxygen Delivery Method: Circle system utilized Preoxygenation: Pre-oxygenation with 100% oxygen Induction Type: IV induction and Rapid sequence Laryngoscope Size: 2 and Miller Grade View: Grade II Tube type: Oral Tube size: 7.5 mm Number of attempts: 1 Airway Equipment and Method: Stylet Placement Confirmation: ETT inserted through vocal cords under direct vision,  positive ETCO2 and breath sounds checked- equal and bilateral Secured at: 22 cm Tube secured with: Tape Dental Injury: Teeth and Oropharynx as per pre-operative assessment  Comments: Anterior view even with downward pressure
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
## 2018-07-15 NOTE — Interval H&P Note (Signed)
History and Physical Interval Note:  07/15/2018 7:01 AM  Todd Choi  has presented today for surgery, with the diagnosis of cholecystitis.  The various methods of treatment have been discussed with the patient and family. After consideration of risks, benefits and other options for treatment, the patient has consented to  Procedure(s): LAPAROSCOPIC CHOLECYSTECTOMY WITH INTRAOPERATIVE CHOLANGIOGRAM (N/A) as a surgical intervention.  The patient's history has been reviewed, patient examined, no change in status, stable for surgery.  I have reviewed the patient's chart and labs.  Questions were answered to the patient's satisfaction.     [name]

## 2018-07-15 NOTE — Progress Notes (Addendum)
Patient's admission assessment and physical assessment have been completed. Social work and case management consults were put in due to patient stating "I have been out of work for five weeks. I need assistance in paying for my medications and hospital/medical expenses." Skin assessment was also completed by Estill Bamberg, RN and I. Skin is intact. No breakdown or pressure injuries noted upon admission assessment. Physician Autumn Messing came to patient's room to discuss the surgery with the patient. Pre-op check list, informed consent, and blood products consent have been completed.

## 2018-07-15 NOTE — Op Note (Addendum)
07/14/2018 - 07/15/2018  8:47 AM  PATIENT:  Todd Choi  58 y.o. male  PRE-OPERATIVE DIAGNOSIS:  Cholecystitis with cholelithiasis  POST-OPERATIVE DIAGNOSIS:  Cholecystitis with cholelithiasis  PROCEDURE:  Procedure(s): LAPAROSCOPIC CHOLECYSTECTOMY WITH INTRAOPERATIVE CHOLANGIOGRAM (N/A)  SURGEON:  Surgeon(s) and Role:    Jovita Kussmaul, MD - Primary  PHYSICIAN ASSISTANT:   ASSISTANTS: Dr. Marcello Moores   ANESTHESIA:   local and general  EBL:  10 mL   BLOOD ADMINISTERED:none  DRAINS: none   LOCAL MEDICATIONS USED:  MARCAINE     SPECIMEN:  Source of Specimen:  gallbladder  DISPOSITION OF SPECIMEN:  PATHOLOGY  COUNTS:  YES  TOURNIQUET:  * No tourniquets in log *  DICTATION: .Dragon Dictation     Procedure: After informed consent was obtained the patient was brought to the operating room and placed in the supine position on the operating room table. After adequate induction of general anesthesia the patient's abdomen was prepped with ChloraPrep allowed to dry and draped in usual sterile manner. An appropriate timeout was performed. The area below the umbilicus was infiltrated with quarter percent  Marcaine. A small incision was made with a 15 blade knife. The incision was carried down through the subcutaneous tissue bluntly with a hemostat and [employer] retractors. The linea alba was identified. The linea alba was incised with a 15 blade knife and each side was grasped with Coker clamps. The preperitoneal space was then probed with a hemostat until the peritoneum was opened and access was gained to the abdominal cavity. A 0 Vicryl pursestring stitch was placed in the fascia surrounding the opening. A Hassan cannula was then placed through the opening and anchored in place with the previously placed Vicryl purse string stitch. The abdomen was insufflated with carbon dioxide without difficulty. A laparoscope was inserted through the [facility] cannula in the right upper quadrant was  inspected. Next the epigastric region was infiltrated with % Marcaine. A small incision was made with a 15 blade knife. A 5 mm port was placed bluntly through this incision into the abdominal cavity under direct vision. Next 2 sites were chosen laterally on the right side of the abdomen for placement of 5 mm ports. Each of these areas was infiltrated with quarter percent Marcaine. Small stab incisions were made with a 15 blade knife. 5 mm ports were then placed bluntly through these incisions into the abdominal cavity under direct vision without difficulty. A blunt grasper was placed through the lateralmost 5 mm port and used to grasp the dome of the gallbladder and elevated anteriorly and superiorly. Another blunt grasper was placed through the other 5 mm port and used to retract the body and neck of the gallbladder. A dissector was placed through the epigastric port and using the electrocautery the peritoneal reflection at the gallbladder neck was opened. Blunt dissection was then carried out in this area until the gallbladder neck-cystic duct junction was readily identified and a good window was created. A single clip was placed on the gallbladder neck. A small  ductotomy was made just below the clip with laparoscopic scissors. A 14-gauge Angiocath was then placed through the anterior abdominal wall under direct vision. A Reddick cholangiogram catheter was then placed through the Angiocath and flushed. The catheter was then placed in the cystic duct and anchored in place with a clip. A cholangiogram was obtained that showed no filling defects good emptying into the duodenum an adequate length on the cystic duct. The anchoring clip and catheters were then  removed from the patient. 3 clips were placed proximally on the cystic duct and the duct was divided between the 2 sets of clips. Posterior to this the cystic artery was identified and again dissected bluntly in a circumferential manner until a good window  was  created. 2 clips were placed proximally and one distally on the artery and the artery was divided between the 2 sets of clips. Next a laparoscopic hook cautery device was used to separate the gallbladder from the liver bed. Prior to completely detaching the gallbladder from the liver bed the liver bed was inspected and several small bleeding points were coagulated with the electrocautery until the area was completely hemostatic. The gallbladder was then detached the rest of it from the liver bed without difficulty. A laparoscopic bag was inserted through the hassan port. The laparoscope was moved to the epigastric port. The gallbladder was placed within the bag and the bag was sealed.  The bag with the gallbladder was then removed with the [facility] cannula through the infraumbilical port without difficulty. The fascial defect was then closed with the previously placed Vicryl pursestring stitch as well as with another figure-of-eight 0 Vicryl stitch. The liver bed was inspected again and found to be hemostatic. The abdomen was irrigated with copious amounts of saline until the effluent was clear. The ports were then removed under direct vision without difficulty and were found to be hemostatic. The gas was allowed to escape. The skin incisions were all closed with interrupted 4-0 Monocryl subcuticular stitches. Dermabond dressings were applied. The patient tolerated the procedure well. At the end of the case all needle sponge and instrument counts were correct. The patient was then awakened and taken to recovery in stable condition. The gallbladder was inflamed and the assistant was instrumental with retraction and visualization for the success of the case  PLAN OF CARE: Admit for overnight observation  PATIENT DISPOSITION:  PACU - hemodynamically stable.   Delay start of Pharmacological VTE agent (>24hrs) due to surgical blood loss or risk of bleeding: no

## 2018-07-16 ENCOUNTER — Encounter (HOSPITAL_COMMUNITY): Payer: Self-pay | Admitting: General Surgery

## 2018-10-10 ENCOUNTER — Other Ambulatory Visit: Payer: Self-pay

## 2018-10-10 DIAGNOSIS — Z20822 Contact with and (suspected) exposure to covid-19: Secondary | ICD-10-CM

## 2018-10-14 LAB — NOVEL CORONAVIRUS, NAA: SARS-CoV-2, NAA: NOT DETECTED

## 2018-10-17 ENCOUNTER — Telehealth: Payer: Self-pay | Admitting: Family Medicine

## 2018-10-17 NOTE — Telephone Encounter (Signed)
Informed patient of negative covid-19 result. Patient verbalized understanding.

## 2019-04-02 ENCOUNTER — Telehealth: Payer: Self-pay | Admitting: *Deleted

## 2019-04-02 NOTE — Telephone Encounter (Signed)
Tried contacting pt's mom x 1, unable to leave a voicemail due to mailbox not set up.

## 2019-04-02 NOTE — Telephone Encounter (Signed)
Copied from [place] [phone]. Topic: Appointment Scheduling - Scheduling Inquiry for Clinic >> Apr 02, 2019 11:02 AM Alease Frame wrote: Reason for CRM: Patient is needing an appt for urine issues today or tomorrow 152021/162021 . Please advise

## 2019-04-02 NOTE — Telephone Encounter (Signed)
Pt is scheduled for tomorrow at 7:30am for in office.

## 2019-04-02 NOTE — Telephone Encounter (Signed)
I left pt a voicemail to call the office back to schedule an appointment for tomorrow, pcp full today. Okay to use a virtual slot for tomorrow as an in office for this pt.

## 2019-04-02 NOTE — Telephone Encounter (Signed)
Will discuss with pt during his visit tomorrow morning.

## 2019-04-02 NOTE — Telephone Encounter (Signed)
Patient called back to schedule for the virtual visit for tomorrow I was not able to speak to someone to help him. Please call him to schedule this visit. Can be reached at Ph#  [phone]
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
## 2019-04-03 ENCOUNTER — Ambulatory Visit (INDEPENDENT_AMBULATORY_CARE_PROVIDER_SITE_OTHER): Payer: Self-pay | Admitting: Family Medicine

## 2019-04-03 ENCOUNTER — Encounter: Payer: Self-pay | Admitting: Family Medicine

## 2019-04-03 ENCOUNTER — Telehealth: Payer: Self-pay | Admitting: Family Medicine

## 2019-04-03 ENCOUNTER — Other Ambulatory Visit: Payer: Self-pay

## 2019-04-03 VITALS — BP 128/80 | HR 73 | Temp 96.2°F | Resp 16 | Ht 72.0 in | Wt 180.2 lb

## 2019-04-03 DIAGNOSIS — F411 Generalized anxiety disorder: Secondary | ICD-10-CM

## 2019-04-03 DIAGNOSIS — R350 Frequency of micturition: Secondary | ICD-10-CM

## 2019-04-03 DIAGNOSIS — E785 Hyperlipidemia, unspecified: Secondary | ICD-10-CM

## 2019-04-03 DIAGNOSIS — F410 Panic disorder [episodic paroxysmal anxiety] without agoraphobia: Secondary | ICD-10-CM

## 2019-04-03 DIAGNOSIS — Z23 Encounter for immunization: Secondary | ICD-10-CM

## 2019-04-03 LAB — BASIC METABOLIC PANEL
BUN: 22 mg/dL (ref 6–23)
CO2: 32 mEq/L (ref 19–32)
Calcium: 9.6 mg/dL (ref 8.4–10.5)
Chloride: 101 mEq/L (ref 96–112)
Creatinine, Ser: 0.85 mg/dL (ref 0.40–1.50)
GFR: 92.43 mL/min (ref >60.00)
Glucose, Bld: 105 mg/dL — ABNORMAL HIGH (ref 70–99)
Potassium: 4.7 mEq/L (ref 3.5–5.1)
Sodium: 139 mEq/L (ref 135–145)

## 2019-04-03 LAB — URINALYSIS, ROUTINE W REFLEX MICROSCOPIC
Bilirubin Urine: NEGATIVE
Hgb urine dipstick: NEGATIVE
Ketones, ur: NEGATIVE
Leukocytes,Ua: NEGATIVE
Nitrite: NEGATIVE
RBC / HPF: NONE SEEN (ref 0–?)
Specific Gravity, Urine: 1.025 (ref 1.000–1.030)
Total Protein, Urine: NEGATIVE
Urine Glucose: NEGATIVE
Urobilinogen, UA: 0.2 (ref 0.0–1.0)
pH: 6 (ref 5.0–8.0)

## 2019-04-03 LAB — LIPID PANEL
Cholesterol: 148 mg/dL (ref 0–200)
HDL: 46.9 mg/dL (ref >39.00)
LDL Cholesterol: 71 mg/dL (ref 0–99)
NonHDL: 101.42
Total CHOL/HDL Ratio: 3
Triglycerides: 152 mg/dL — ABNORMAL HIGH (ref 0.0–149.0)
VLDL: 30.4 mg/dL (ref 0.0–40.0)

## 2019-04-03 LAB — PSA: PSA: 0.51 ng/mL (ref 0.10–4.00)

## 2019-04-03 LAB — HEMOGLOBIN A1C: Hgb A1c MFr Bld: 5.3 % (ref 4.6–6.5)

## 2019-04-03 NOTE — Telephone Encounter (Signed)
Noted. Will notify pt as soon as we have his labs back & reviewed.  Will print a copy for him.

## 2019-04-03 NOTE — Progress Notes (Signed)
ACUTE VISIT   HPI:  Chief Complaint  Patient presents with  . urinary issues    prostate issues, requesting referral to urology    [name] is a 59 y.o. male, who is here today complaining of "a few months" of urinary frequency, decreased urinary stream, nocturia x1. Negative for dysuria, gross hematuria, or urgency. Problems progressively getting worse. He has not identified exacerbating or alleviating factors.  + Smoker. His glucose has been mildly elevated in the past.  Negative for fever, chills, abnormal weight loss, abdominal pain, nausea, vomiting, changes in bowel habits, polydipsia,polyuria, or polyphagia.  Negative for rectal pain. His psychiatrist did an examination and recommended Flomax 0.4 mg daily, which helped but noted having an erection "all night" while taking medication. No testicular or penial pain.  Lab Results  Component Value Date   CREATININE 0.75 07/14/2018   BUN 21 (H) 07/14/2018   NA 138 07/14/2018   K 4.0 07/14/2018   CL 103 07/14/2018   CO2 24 07/14/2018   He also would like to have lipid panel check.  Lab Results  Component Value Date   CHOL 163 08/12/2016   HDL 32.00 (L) 08/12/2016   LDLCALC 102 (H) 08/12/2016   TRIG 148.0 08/12/2016   CHOLHDL 5 08/12/2016   Since his last visit, 08/24/2017, established with psychiatrist. Currently is on Lexapro and trazodone. Due to COVID-19 pandemia he lost his job and his house.  Review of Systems  Constitutional: Positive for fatigue. Negative for appetite change and fever.  HENT: Negative for mouth sores and sore throat.   Respiratory: Negative for cough, shortness of breath and wheezing.   Cardiovascular: Negative for chest pain, palpitations and leg swelling.  Genitourinary: Negative for decreased urine volume, discharge, flank pain, penile swelling and scrotal swelling.  Musculoskeletal: Negative for back pain and myalgias.  Skin: Negative for color change and rash.    Psychiatric/Behavioral: Positive for sleep disturbance. Negative for confusion. The patient is nervous/anxious.   Rest see pertinent positives and negatives per HPI.   Current Outpatient Medications on File Prior to Visit  Medication Sig Dispense Refill  . ibuprofen (ADVIL) 200 MG tablet Take 400-600 mg by mouth every 6 (six) hours as needed for headache, mild pain, moderate pain or cramping.     No current facility-administered medications on file prior to visit.    Past Medical History:  Diagnosis Date  . Depression   . Hepatitis    HEP C     WITH TREATMENT  . History of drug abuse ([place])    IN RECOVERY  . Sleep apnea    does not wear a CPAP   No Known Allergies  Social History   Socioeconomic History  . Marital status: Single    Spouse name: Not on file  . Number of children: Not on file  . Years of education: Not on file  . Highest education level: Not on file  Occupational History  . Not on file  Tobacco Use  . Smoking status: Current Every Day Smoker    Years: 35.00    Types: Cigarettes  . Smokeless tobacco: Never Used  . Tobacco comment: about to start Welbutrin to quit completely  Substance and Sexual Activity  . Alcohol use: No    Comment: HISTORY OF   . Drug use: Yes    Types: Cocaine  . Sexual activity: Yes  Other Topics Concern  . Not on file  Social History Narrative  . Not on  file   Social Determinants of Health   Financial Resource Strain:   . Difficulty of Paying Living Expenses: Not on file  Food Insecurity:   . Worried About Charity fundraiser in the Last Year: Not on file  . Ran Out of Food in the Last Year: Not on file  Transportation Needs:   . Lack of Transportation (Medical): Not on file  . Lack of Transportation (Non-Medical): Not on file  Physical Activity:   . Days of Exercise per Week: Not on file  . Minutes of Exercise per Session: Not on file  Stress:   . Feeling of Stress : Not on file  Social Connections:   . Frequency  of Communication with Friends and Family: Not on file  . Frequency of Social Gatherings with Friends and Family: Not on file  . Attends Religious Services: Not on file  . Active Member of Clubs or Organizations: Not on file  . Attends Archivist Meetings: Not on file  . Marital Status: Not on file    Vitals:   04/03/19 0726  BP: 128/80  Pulse: 73  Resp: 16  Temp: (!) 96.2 F (35.7 C)  SpO2: 98%   Body mass index is 24.45 kg/m.  Physical Exam  Nursing note and vitals reviewed. Constitutional: He is oriented to person, place, and time. He appears well-developed and well-nourished. No distress.  HENT:  Head: Normocephalic and atraumatic.  Eyes: Conjunctivae are normal.  Cardiovascular: Normal rate and regular rhythm.  No murmur heard. Respiratory: Effort normal and breath sounds normal. No respiratory distress.  GI: Soft. He exhibits no mass. There is no hepatomegaly. There is no abdominal tenderness. There is no CVA tenderness.  Genitourinary:    Genitourinary Comments: Deferred to urologist.   Musculoskeletal:        General: No edema.  Lymphadenopathy:    He has no cervical adenopathy.  Neurological: He is alert and oriented to person, place, and time. He has normal strength.  Skin: Skin is warm. No erythema.  Psychiatric: His mood appears anxious.  Well groomed,good eye contact.    ASSESSMENT AND PLAN:  [name] was seen today for urinary issues.  Diagnoses and all orders for this visit:  Lab Results  Component Value Date   PSA 0.51 04/03/2019    Lab Results  Component Value Date   CREATININE 0.85 04/03/2019   BUN 22 04/03/2019   NA 139 04/03/2019   K 4.7 04/03/2019   CL 101 04/03/2019   CO2 32 04/03/2019   Lab Results  Component Value Date   CHOL 148 04/03/2019   HDL 46.90 04/03/2019   LDLCALC 71 04/03/2019   TRIG 152.0 (H) 04/03/2019   CHOLHDL 3 04/03/2019   Lab Results  Component Value Date   HGBA1C 5.3 04/03/2019    Urinary  frequency Most likely BPH but given his history of tobacco use other more serious etiologies need to be considered. He is not interested in trying other pharmacologic options, he prefers to see an urologist. Referral placed. Further recommendations will be given according to lab results. Smoking cessation encouraged.  -     Ambulatory referral to Urology -     Basic Metabolic Panel -     Hemoglobin A1c -     Urinalysis, Routine w reflex microscopic -     PSA  Dyslipidemia (high LDL; low HDL) Continue nonpharmacologic treatment. Further recommendation will be given according to lipid panel results and 10 years CVD risk. -  Lipid panel  Need for influenza vaccination -     Flu Vaccine QUAD 6+ mos PF IM (Fluarix Quad PF)  Generalized anxiety disorder with panic attacks Following with psychiatrist.  Return in 1 year (on 04/02/2020), or if symptoms worsen or fail to improve.    Persais Ethridge G. Martinique, MD  [facility]. [place] office.

## 2019-04-03 NOTE — Patient Instructions (Signed)
A few things to remember from today's visit:   Urinary frequency - Plan: Ambulatory referral to Urology, Basic Metabolic Panel, Hemoglobin A1c, Urinalysis, Routine w reflex microscopic, PSA  Dyslipidemia (high LDL; low HDL) - Plan: Lipid panel   Benign Prostatic Hyperplasia  Benign prostatic hyperplasia (BPH) is an enlarged prostate gland that is caused by the normal aging process and not by cancer. The prostate is a walnut-sized gland that is involved in the production of semen. It is located in front of the rectum and below the bladder. The bladder stores urine and the urethra is the tube that carries the urine out of the body. The prostate may get bigger as a man gets older. An enlarged prostate can press on the urethra. This can make it harder to pass urine. The build-up of urine in the bladder can cause infection. Back pressure and infection may progress to bladder damage and kidney (renal) failure. What are the causes? This condition is part of a normal aging process. However, not all men develop problems from this condition. If the prostate enlarges away from the urethra, urine flow will not be blocked. If it enlarges toward the urethra and compresses it, there will be problems passing urine. What increases the risk? This condition is more likely to develop in men over the age of 77 years. What are the signs or symptoms? Symptoms of this condition include:  Getting up often during the night to urinate.  Needing to urinate frequently during the day.  Difficulty starting urine flow.  Decrease in size and strength of your urine stream.  Leaking (dribbling) after urinating.  Inability to pass urine. This needs immediate treatment.  Inability to completely empty your bladder.  Pain when you pass urine. This is more common if there is also an infection.  Urinary tract infection (UTI). How is this diagnosed? This condition is diagnosed based on your medical history, a physical  exam, and your symptoms. Tests will also be done, such as:  A post-void bladder scan. This measures any amount of urine that may remain in your bladder after you finish urinating.  A digital rectal exam. In a rectal exam, your health care provider checks your prostate by putting a lubricated, gloved finger into your rectum to feel the back of your prostate gland. This exam detects the size of your gland and any abnormal lumps or growths.  An exam of your urine (urinalysis).  A prostate specific antigen (PSA) screening. This is a blood test used to screen for prostate cancer.  An ultrasound. This test uses sound waves to electronically produce a picture of your prostate gland. Your health care provider may refer you to a specialist in kidney and prostate diseases (urologist). How is this treated? Once symptoms begin, your health care provider will monitor your condition (active surveillance or watchful waiting). Treatment for this condition will depend on the severity of your condition. Treatment may include:  Observation and yearly exams. This may be the only treatment needed if your condition and symptoms are mild.  Medicines to relieve your symptoms, including: ? Medicines to shrink the prostate. ? Medicines to relax the muscle of the prostate.  Surgery in severe cases. Surgery may include: ? Prostatectomy. In this procedure, the prostate tissue is removed completely through an open incision or with a laparoscope or robotics. ? Transurethral resection of the prostate (TURP). In this procedure, a tool is inserted through the opening at the tip of the penis (urethra). It is used to  cut away tissue of the inner core of the prostate. The pieces are removed through the same opening of the penis. This removes the blockage. ? Transurethral incision (TUIP). In this procedure, small cuts are made in the prostate. This lessens the prostate's pressure on the urethra. ? Transurethral microwave  thermotherapy (TUMT). This procedure uses microwaves to create heat. The heat destroys and removes a small amount of prostate tissue. ? Transurethral needle ablation (TUNA). This procedure uses radio frequencies to destroy and remove a small amount of prostate tissue. ? Interstitial laser coagulation ([place]). This procedure uses a laser to destroy and remove a small amount of prostate tissue. ? Transurethral electrovaporization (TUVP). This procedure uses electrodes to destroy and remove a small amount of prostate tissue. ? Prostatic urethral lift. This procedure inserts an implant to push the lobes of the prostate away from the urethra. Follow these instructions at home:  Take over-the-counter and prescription medicines only as told by your health care provider.  Monitor your symptoms for any changes. Contact your health care provider with any changes.  Avoid drinking large amounts of liquid before going to bed or out in public.  Avoid or reduce how much caffeine or alcohol you drink.  Give yourself time when you urinate.  Keep all follow-up visits as told by your health care provider. This is important. Contact a health care provider if:  You have unexplained back pain.  Your symptoms do not get better with treatment.  You develop side effects from the medicine you are taking.  Your urine becomes very dark or has a bad smell.  Your lower abdomen becomes distended and you have trouble passing your urine. Get help right away if:  You have a fever or chills.  You suddenly cannot urinate.  You feel lightheaded, or very dizzy, or you faint.  There are large amounts of blood or clots in the urine.  Your urinary problems become hard to manage.  You develop moderate to severe low back or flank pain. The flank is the side of your body between the ribs and the hip. These symptoms may represent a serious problem that is an emergency. Do not wait to see if the symptoms will go away. Get  medical help right away. Call your local emergency services (911 in the [place] Do not drive yourself to the hospital. Summary  Benign prostatic hyperplasia (BPH) is an enlarged prostate that is caused by the normal aging process and not by cancer.  An enlarged prostate can press on the urethra. This can make it hard to pass urine.  This condition is part of a normal aging process and is more likely to develop in men over the age of 48 years.  Get help right away if you suddenly cannot urinate. This information is not intended to replace advice given to you by your health care provider. Make sure you discuss any questions you have with your health care provider. Document Revised: 02/06/2018 Document Reviewed: 04/18/2016 Elsevier Patient Education  [place].  Please be sure medication list is accurate. If a new problem present, please set up appointment sooner than planned today.

## 2019-04-03 NOTE — Telephone Encounter (Signed)
Patient is requesting a call as soon as his results come back.  He needs to come by and get a print out of his labs.

## 2019-04-24 ENCOUNTER — Ambulatory Visit (INDEPENDENT_AMBULATORY_CARE_PROVIDER_SITE_OTHER): Payer: Self-pay | Admitting: Urology

## 2019-04-24 ENCOUNTER — Other Ambulatory Visit: Payer: Self-pay

## 2019-04-24 VITALS — BP 130/74 | HR 84 | Ht 72.0 in | Wt 179.0 lb

## 2019-04-24 DIAGNOSIS — N401 Enlarged prostate with lower urinary tract symptoms: Secondary | ICD-10-CM

## 2019-04-24 DIAGNOSIS — R350 Frequency of micturition: Secondary | ICD-10-CM

## 2019-04-24 LAB — BLADDER SCAN AMB NON-IMAGING: SCA Result: 4

## 2019-04-25 ENCOUNTER — Encounter: Payer: Self-pay | Admitting: Urology

## 2019-04-25 DIAGNOSIS — N401 Enlarged prostate with lower urinary tract symptoms: Secondary | ICD-10-CM | POA: Insufficient documentation

## 2019-04-25 LAB — URINALYSIS, COMPLETE
Bilirubin, UA: NEGATIVE
Glucose, UA: NEGATIVE
Ketones, UA: NEGATIVE
Leukocytes,UA: NEGATIVE
Nitrite, UA: NEGATIVE
Protein,UA: NEGATIVE
Specific Gravity, UA: 1.025 (ref 1.005–1.030)
Urobilinogen, Ur: 0.2 mg/dL (ref 0.2–1.0)
pH, UA: 5.5 (ref 5.0–7.5)

## 2019-04-25 LAB — MICROSCOPIC EXAMINATION
Bacteria, UA: NONE SEEN
Epithelial Cells (non renal): NONE SEEN /hpf (ref 0–10)
WBC, UA: NONE SEEN /hpf (ref 0–5)
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
## 2019-05-01 ENCOUNTER — Ambulatory Visit: Payer: Self-pay | Admitting: Urology

## 2019-10-19 ENCOUNTER — Emergency Department (HOSPITAL_COMMUNITY): Payer: Self-pay

## 2019-10-19 ENCOUNTER — Other Ambulatory Visit: Payer: Self-pay

## 2019-10-19 ENCOUNTER — Encounter (HOSPITAL_COMMUNITY): Payer: Self-pay | Admitting: Emergency Medicine

## 2019-10-19 DIAGNOSIS — F419 Anxiety disorder, unspecified: Secondary | ICD-10-CM | POA: Insufficient documentation

## 2019-10-19 DIAGNOSIS — R0789 Other chest pain: Secondary | ICD-10-CM | POA: Insufficient documentation

## 2019-10-19 DIAGNOSIS — Z5321 Procedure and treatment not carried out due to patient leaving prior to being seen by health care provider: Secondary | ICD-10-CM | POA: Insufficient documentation

## 2019-10-19 DIAGNOSIS — R0602 Shortness of breath: Secondary | ICD-10-CM | POA: Insufficient documentation

## 2019-10-19 LAB — BASIC METABOLIC PANEL
Anion gap: 12 (ref 5–15)
BUN: 21 mg/dL — ABNORMAL HIGH (ref 6–20)
CO2: 23 mmol/L (ref 22–32)
Calcium: 9.7 mg/dL (ref 8.9–10.3)
Chloride: 106 mmol/L (ref 98–111)
Creatinine, Ser: 0.79 mg/dL (ref 0.61–1.24)
GFR calc Af Amer: >60 mL/min (ref >60)
GFR calc non Af Amer: >60 mL/min (ref >60)
Glucose, Bld: 120 mg/dL — ABNORMAL HIGH (ref 70–99)
Potassium: 4.6 mmol/L (ref 3.5–5.1)
Sodium: 141 mmol/L (ref 135–145)

## 2019-10-19 LAB — CBC
HCT: 48.3 % (ref 39.0–52.0)
Hemoglobin: 16.7 g/dL (ref 13.0–17.0)
MCH: 31.1 pg (ref 26.0–34.0)
MCHC: 34.6 g/dL (ref 30.0–36.0)
MCV: 89.9 fL (ref 80.0–100.0)
Platelets: 254 10*3/uL (ref 150–400)
RBC: 5.37 MIL/uL (ref 4.22–5.81)
RDW: 13.3 % (ref 11.5–15.5)
WBC: 8.7 10*3/uL (ref 4.0–10.5)
nRBC: 0 % (ref 0.0–0.2)

## 2019-10-19 LAB — TROPONIN I (HIGH SENSITIVITY): Troponin I (High Sensitivity): 2 ng/L (ref <18)

## 2019-10-19 MED ORDER — SODIUM CHLORIDE 0.9% FLUSH: 3.0000 mL | Freq: Once | INTRAVENOUS | Status: DC

## 2019-10-19 NOTE — ED Notes (Signed)
No answer when called to draw labs

## 2019-10-19 NOTE — ED Triage Notes (Signed)
Patient presents with chest tightness and shortness of breath that began a few days ago and has worsened. Patient states he has been under a lot of stress lately and has had some interpersonal issues causing some anxiety, and yesterday he did a line of cocaine which he does not normally do. Patient is tachypneic.

## 2019-10-20 ENCOUNTER — Emergency Department (HOSPITAL_COMMUNITY)
Admission: EM | Admit: 2019-10-20 | Discharge: 2019-10-20 | Disposition: A | Discharge status: Home / Self Care | Payer: Self-pay | Attending: Emergency Medicine | Admitting: Emergency Medicine

## 2021-01-01 IMAGING — CR DG CHEST 2V
2 series · 2 of 2 positions shown · non-contrast
Comparison: August 14, 2003

CLINICAL DATA: Chest tightness.

EXAM:
CHEST - 2 VIEW

[w chest pa]
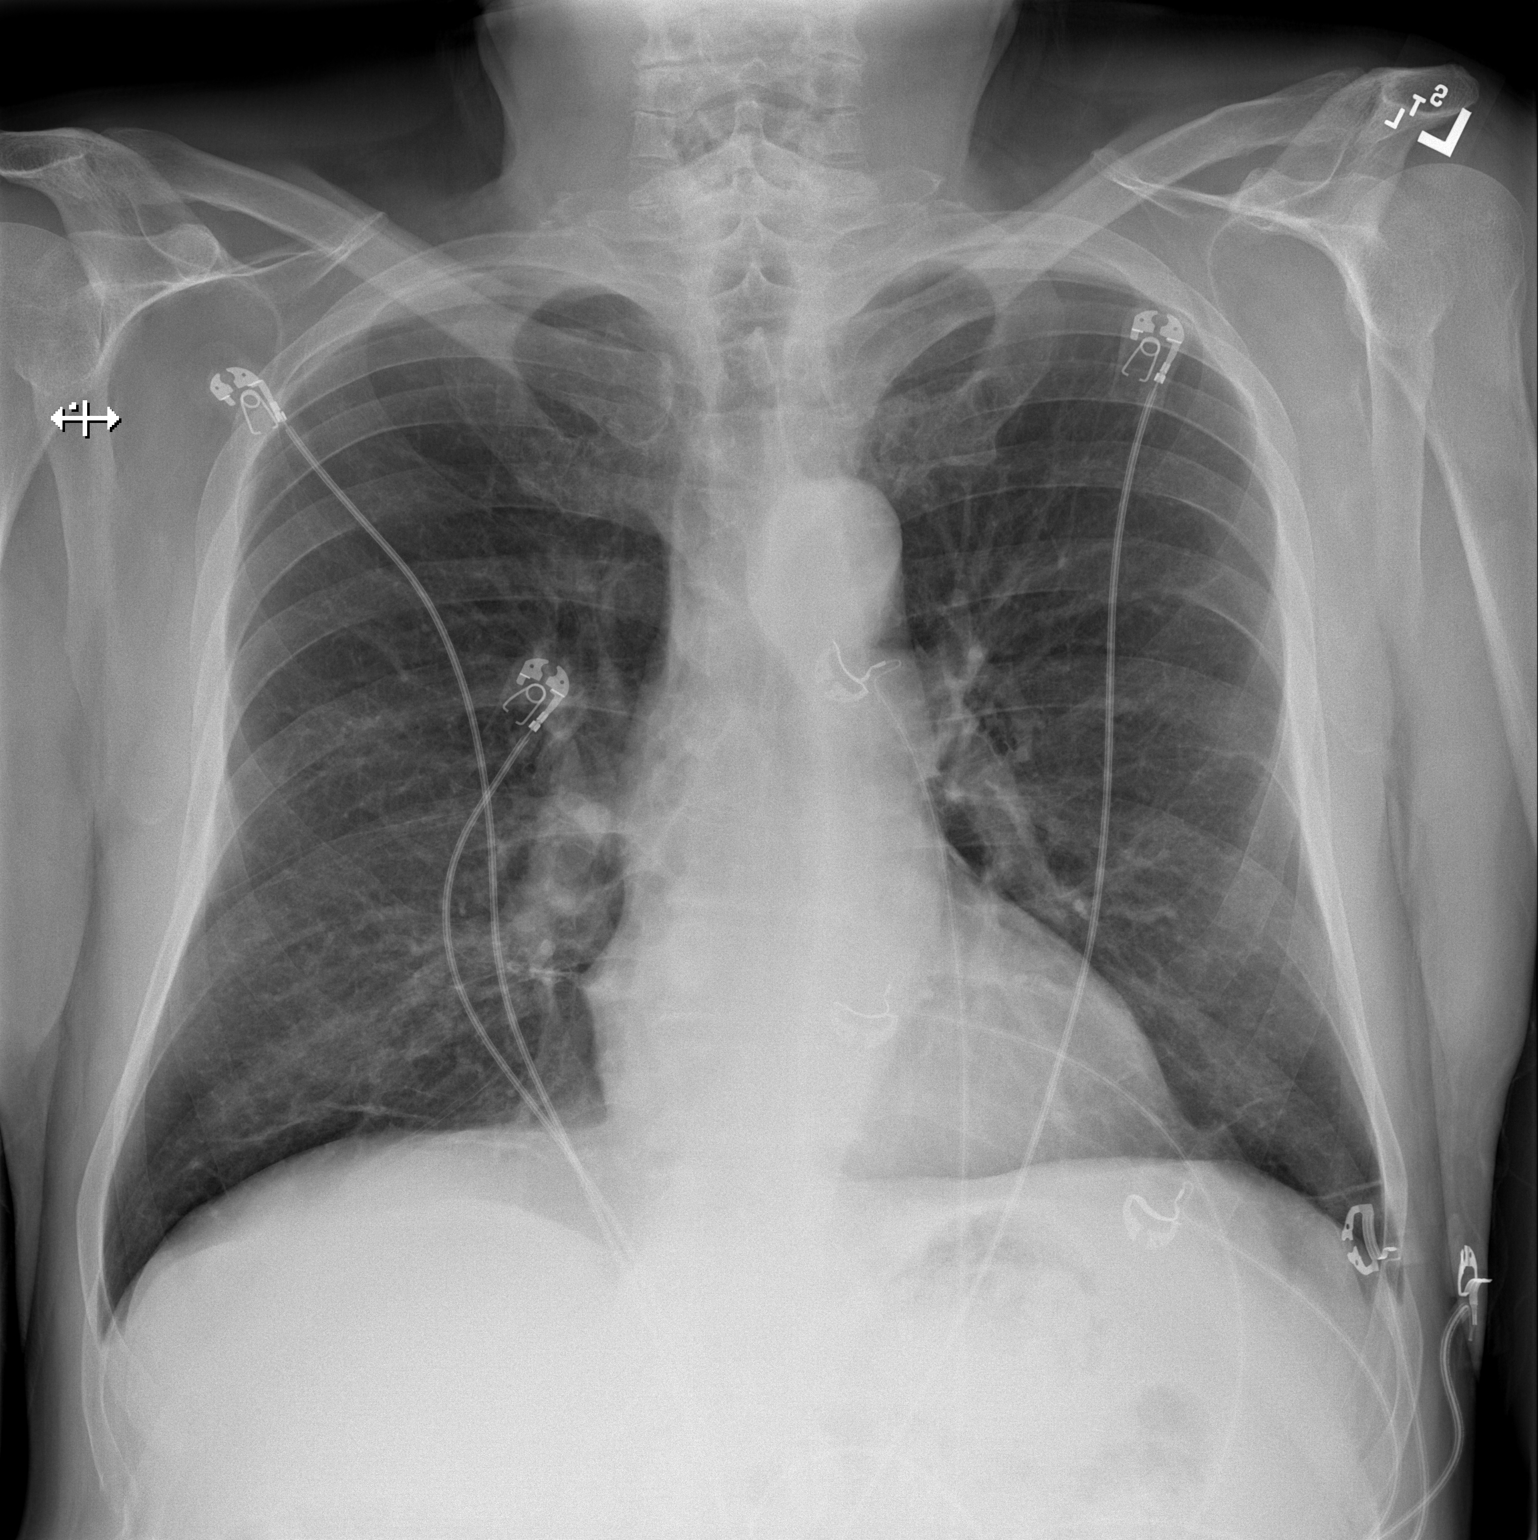

[w chest lat]
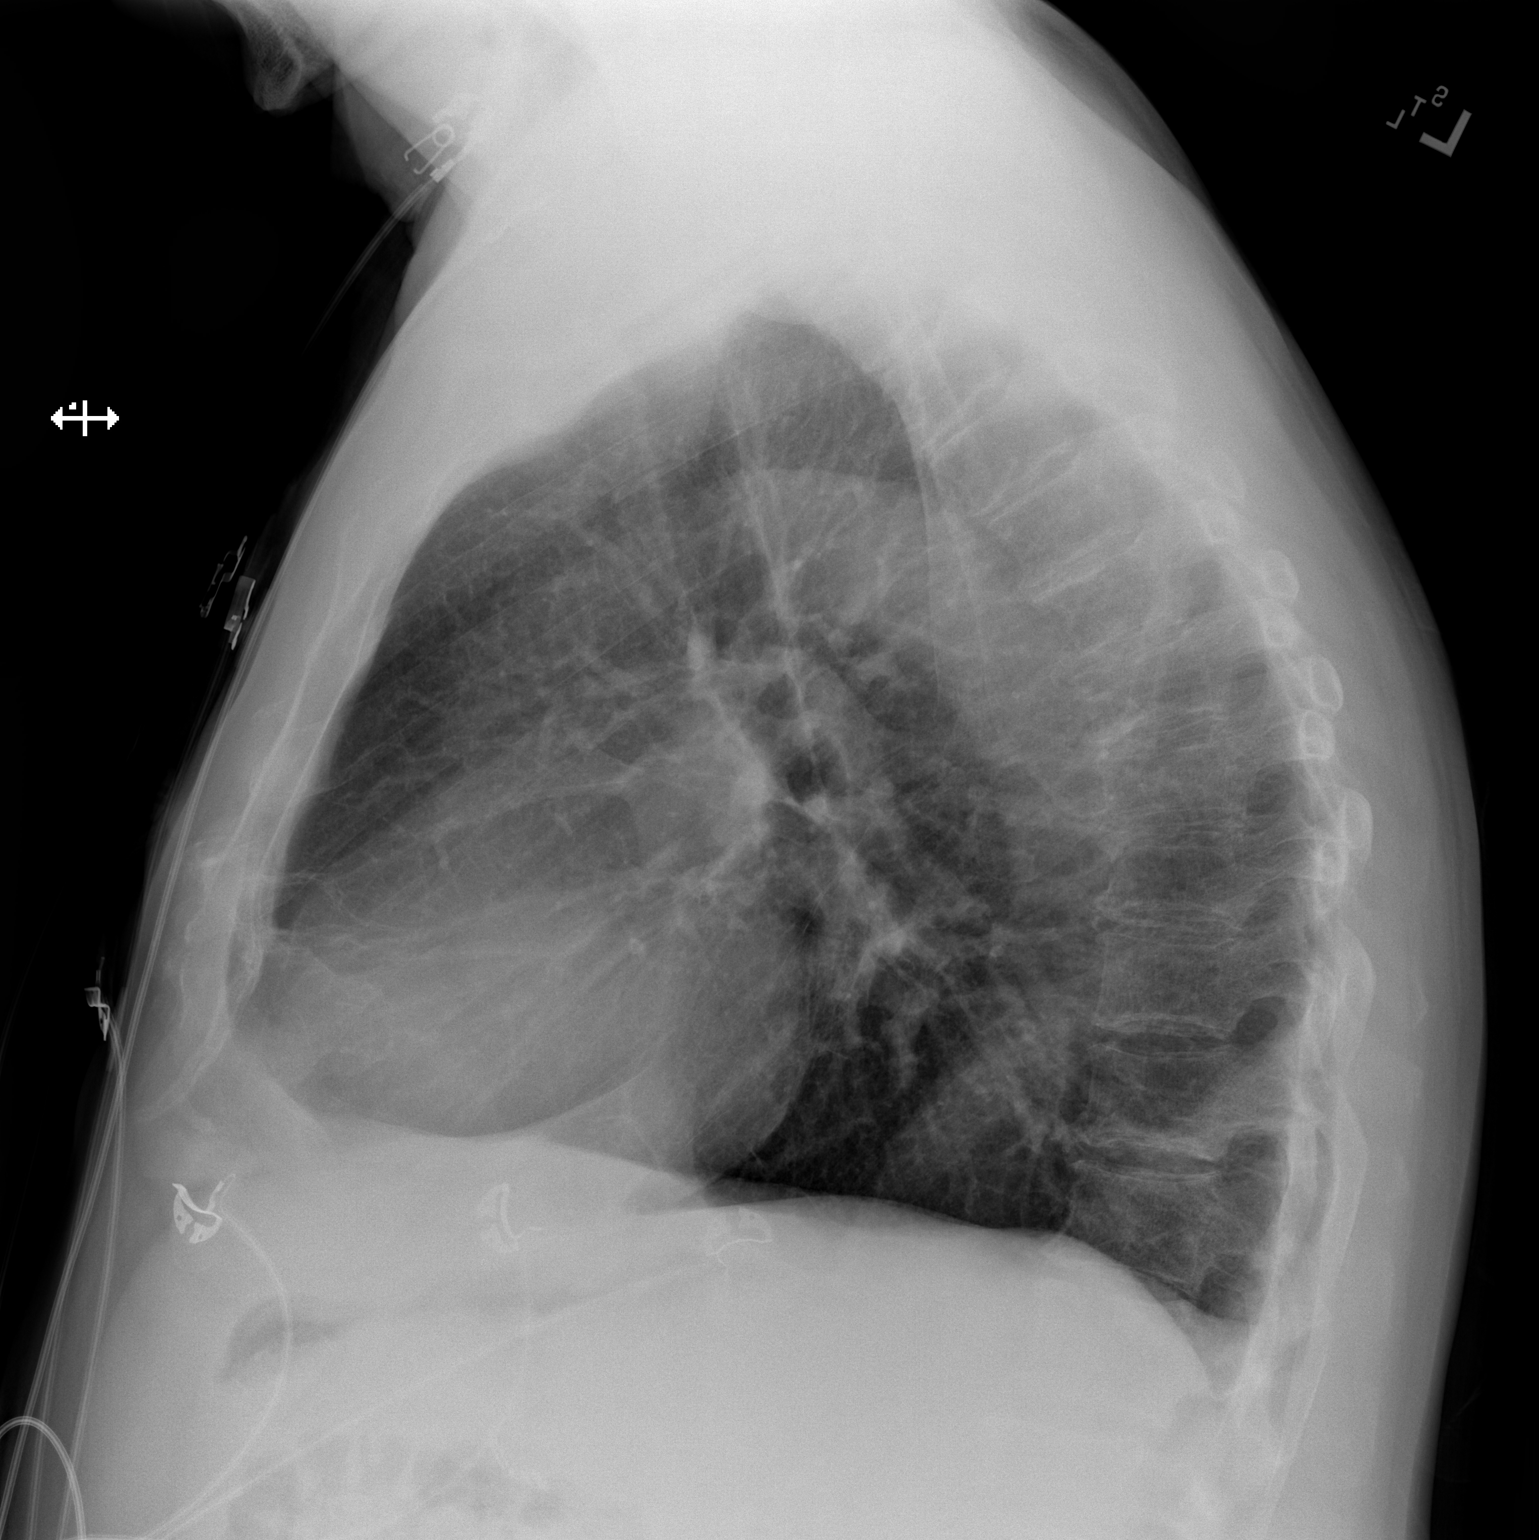

[2 of 2 positions shown; findings below may reference images not displayed]

FINDINGS: The heart size and mediastinal contours are within normal limits.
Both lungs are clear. The visualized skeletal structures are
unremarkable.
IMPRESSION: No active cardiopulmonary disease.

## 2021-04-29 ENCOUNTER — Ambulatory Visit (INDEPENDENT_AMBULATORY_CARE_PROVIDER_SITE_OTHER): Payer: Self-pay | Admitting: Primary Care

## 2021-04-29 ENCOUNTER — Other Ambulatory Visit: Payer: Self-pay

## 2021-04-29 ENCOUNTER — Encounter (INDEPENDENT_AMBULATORY_CARE_PROVIDER_SITE_OTHER): Payer: Self-pay | Admitting: Primary Care

## 2021-04-29 VITALS — BP 121/81 | HR 74 | Temp 97.7°F | Ht 72.0 in | Wt 181.0 lb

## 2021-04-29 DIAGNOSIS — R197 Diarrhea, unspecified: Secondary | ICD-10-CM

## 2021-04-29 DIAGNOSIS — Z7689 Persons encountering health services in other specified circumstances: Secondary | ICD-10-CM

## 2021-04-29 DIAGNOSIS — F418 Other specified anxiety disorders: Secondary | ICD-10-CM

## 2021-04-29 DIAGNOSIS — Z131 Encounter for screening for diabetes mellitus: Secondary | ICD-10-CM

## 2021-04-29 LAB — POCT GLYCOSYLATED HEMOGLOBIN (HGB A1C): Hemoglobin A1C: 5.6 % (ref 4.0–5.6)
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
## 2021-04-29 NOTE — Patient Instructions (Signed)
Diarrhea, Adult ?Diarrhea is frequent loose and watery bowel movements. Diarrhea can make you feel weak and cause you to become dehydrated. Dehydration can make you tired and thirsty, cause you to have a dry mouth, and decrease how often you urinate. ?Diarrhea typically lasts 2-3 days. However, it can last longer if it is a sign of something more serious. It is important to treat your diarrhea as told by your health care provider. ?Follow these instructions at home: ?Eating and drinking ?  ?Follow these recommendations as told by your health care provider: ?Take an oral rehydration solution (ORS). This is an over-the-counter medicine that helps return your body to its normal balance of nutrients and water. It is found at pharmacies and retail stores. ?Drink plenty of fluids, such as water, ice chips, diluted fruit juice, and low-calorie sports drinks. You can drink milk also, if desired. ?Avoid drinking fluids that contain a lot of sugar or caffeine, such as energy drinks, sports drinks, and soda. ?Eat bland, easy-to-digest foods in small amounts as you are able. These foods include bananas, applesauce, rice, lean meats, toast, and crackers. ?Avoid alcohol. ?Avoid spicy or fatty foods. ? ?Medicines ?Take over-the-counter and prescription medicines only as told by your health care provider. ?If you were prescribed an antibiotic medicine, take it as told by your health care provider. Do not stop using the antibiotic even if you start to feel better. ?General instructions ? ?Wash your hands often using soap and water. If soap and water are not available, use a hand sanitizer. Others in the household should wash their hands as well. Hands should be washed: ?After using the toilet or changing a diaper. ?Before preparing, cooking, or serving food. ?While caring for a sick person or while visiting someone in a hospital. ?Drink enough fluid to keep your urine pale yellow. ?Rest at home while you recover. ?Watch your  condition for any changes. ?Take a warm bath to relieve any burning or pain from frequent diarrhea episodes. ?Keep all follow-up visits as told by your health care provider. This is important. ?Contact a health care provider if: ?You have a fever. ?Your diarrhea gets worse. ?You have new symptoms. ?You cannot keep fluids down. ?You feel light-headed or dizzy. ?You have a headache. ?You have muscle cramps. ?Get help right away if: ?You have chest pain. ?You feel extremely weak or you faint. ?You have bloody or black stools or stools that look like tar. ?You have severe pain, cramping, or bloating in your abdomen. ?You have trouble breathing or you are breathing very quickly. ?Your heart is beating very quickly. ?Your skin feels cold and clammy. ?You feel confused. ?You have signs of dehydration, such as: ?Dark urine, very little urine, or no urine. ?Cracked lips. ?Dry mouth. ?Sunken eyes. ?Sleepiness. ?Weakness. ?Summary ?Diarrhea is frequent loose and sometimes watery bowel movements. Diarrhea can make you feel weak and cause you to become dehydrated. ?Drink enough fluids to keep your urine pale yellow. ?Make sure that you wash your hands after using the toilet. If soap and water are not available, use hand sanitizer. ?Contact a health care provider if your diarrhea gets worse or you have new symptoms. ?Get help right away if you have signs of dehydration. ?This information is not intended to replace advice given to you by your health care provider. Make sure you discuss any questions you have with your health care provider. ?Document Revised: 09/23/2020 Document Reviewed: 09/23/2020 ?Elsevier Patient Education ? 2022 Elsevier Inc. ? ?

## 2021-04-30 ENCOUNTER — Emergency Department (HOSPITAL_BASED_OUTPATIENT_CLINIC_OR_DEPARTMENT_OTHER)
Admission: EM | Admit: 2021-04-30 | Discharge: 2021-04-30 | Disposition: A | Discharge status: Home / Self Care | Payer: Self-pay | Attending: Emergency Medicine | Admitting: Emergency Medicine

## 2021-04-30 ENCOUNTER — Encounter (HOSPITAL_BASED_OUTPATIENT_CLINIC_OR_DEPARTMENT_OTHER): Payer: Self-pay

## 2021-04-30 ENCOUNTER — Emergency Department (HOSPITAL_BASED_OUTPATIENT_CLINIC_OR_DEPARTMENT_OTHER): Payer: Self-pay

## 2021-04-30 DIAGNOSIS — R112 Nausea with vomiting, unspecified: Secondary | ICD-10-CM | POA: Insufficient documentation

## 2021-04-30 DIAGNOSIS — R197 Diarrhea, unspecified: Secondary | ICD-10-CM | POA: Insufficient documentation

## 2021-04-30 DIAGNOSIS — R109 Unspecified abdominal pain: Secondary | ICD-10-CM | POA: Insufficient documentation

## 2021-04-30 DIAGNOSIS — R140 Abdominal distension (gaseous): Secondary | ICD-10-CM

## 2021-04-30 LAB — COMPREHENSIVE METABOLIC PANEL
ALT: 25 U/L (ref 0–44)
AST: 13 U/L — ABNORMAL LOW (ref 15–41)
Albumin: 4.5 g/dL (ref 3.5–5.0)
Alkaline Phosphatase: 68 U/L (ref 38–126)
Anion gap: 8 (ref 5–15)
BUN: 15 mg/dL (ref 6–20)
CO2: 27 mmol/L (ref 22–32)
Calcium: 9.2 mg/dL (ref 8.9–10.3)
Chloride: 103 mmol/L (ref 98–111)
Creatinine, Ser: 0.67 mg/dL (ref 0.61–1.24)
GFR, Estimated: >60 mL/min (ref >60)
Glucose, Bld: 107 mg/dL — ABNORMAL HIGH (ref 70–99)
Potassium: 4 mmol/L (ref 3.5–5.1)
Sodium: 138 mmol/L (ref 135–145)
Total Bilirubin: 0.6 mg/dL (ref 0.3–1.2)
Total Protein: 7.4 g/dL (ref 6.5–8.1)

## 2021-04-30 LAB — CBC
HCT: 45.2 % (ref 39.0–52.0)
Hemoglobin: 15.1 g/dL (ref 13.0–17.0)
MCH: 29.6 pg (ref 26.0–34.0)
MCHC: 33.4 g/dL (ref 30.0–36.0)
MCV: 88.6 fL (ref 80.0–100.0)
Platelets: 217 10*3/uL (ref 150–400)
RBC: 5.1 MIL/uL (ref 4.22–5.81)
RDW: 13 % (ref 11.5–15.5)
WBC: 6.2 10*3/uL (ref 4.0–10.5)
nRBC: 0 % (ref 0.0–0.2)

## 2021-04-30 LAB — DIFFERENTIAL
Abs Immature Granulocytes: 0.02 10*3/uL (ref 0.00–0.07)
Basophils Absolute: 0 10*3/uL (ref 0.0–0.1)
Basophils Relative: 0 %
Eosinophils Absolute: 0.1 10*3/uL (ref 0.0–0.5)
Eosinophils Relative: 2 %
Immature Granulocytes: 0 %
Lymphocytes Relative: 26 %
Lymphs Abs: 1.6 10*3/uL (ref 0.7–4.0)
Monocytes Absolute: 0.6 10*3/uL (ref 0.1–1.0)
Monocytes Relative: 10 %
Neutro Abs: 3.7 10*3/uL (ref 1.7–7.7)
Neutrophils Relative %: 62 %

## 2021-04-30 LAB — URINALYSIS, ROUTINE W REFLEX MICROSCOPIC
Bilirubin Urine: NEGATIVE
Glucose, UA: NEGATIVE mg/dL
Hgb urine dipstick: NEGATIVE
Ketones, ur: NEGATIVE mg/dL
Leukocytes,Ua: NEGATIVE
Nitrite: NEGATIVE
Protein, ur: NEGATIVE mg/dL
Specific Gravity, Urine: 1.011 (ref 1.005–1.030)
pH: 5.5 (ref 5.0–8.0)

## 2021-04-30 LAB — MAGNESIUM: Magnesium: 2.1 mg/dL (ref 1.7–2.4)

## 2021-04-30 LAB — LIPASE, BLOOD: Lipase: 25 U/L (ref 11–51)

## 2021-04-30 MED ORDER — SODIUM CHLORIDE 0.9 % IV BOLUS
1000.0000 mL | Freq: Once | INTRAVENOUS | Status: AC
  Administered 2021-04-30: 1000 mL via INTRAVENOUS

## 2021-04-30 MED ORDER — ONDANSETRON HCL 4 MG/2ML IJ SOLN
4.0000 mg | Freq: Once | INTRAMUSCULAR | Status: AC
  Administered 2021-04-30: 4 mg via INTRAVENOUS
  Filled 2021-04-30: qty 2

## 2021-04-30 MED ORDER — IOHEXOL 300 MG/ML  SOLN
100.0000 mL | Freq: Once | INTRAMUSCULAR | Status: AC | PRN
  Administered 2021-04-30: 100 mL via INTRAVENOUS

## 2021-04-30 MED ORDER — DICYCLOMINE HCL 20 MG PO TABS: 20.0000 mg | ORAL_TABLET | Freq: Two times a day (BID) | ORAL | 0 refills | Status: DC | PRN

## 2021-04-30 MED ORDER — ONDANSETRON 4 MG PO TBDP: 4.0000 mg | ORAL_TABLET | Freq: Three times a day (TID) | ORAL | 0 refills | Status: DC | PRN

## 2021-04-30 MED ORDER — HYDROXYZINE HCL 25 MG PO TABS: 25.0000 mg | ORAL_TABLET | Freq: Three times a day (TID) | ORAL | 0 refills | Status: DC | PRN

## 2021-04-30 NOTE — ED Notes (Signed)
Patient transported to CT 

## 2021-04-30 NOTE — ED Triage Notes (Signed)
States has not been able to keep food down and sent here by MD for possible fluids  Denies Abdominal pain at present.  Vomiting

## 2021-04-30 NOTE — Discharge Instructions (Signed)
Please follow-up with your PCPs office about the infectious stool studies we talked about.  It is important that someone sends a stool culture to make sure your symptoms are being caused by the type of bacteria infection.

## 2021-04-30 NOTE — ED Provider Notes (Signed)
[facility] EMERGENCY DEPT Provider Note   CSN: [id number] Arrival date & time: 04/30/21  1024     History  Chief Complaint  Patient presents with   Abdominal Pain    Todd Choi is a 61 y.o. male presenting to ED with cramping abdominal pain and diarrhea for 1 week.  The patient reports that he ate an [place] sandwich on the day preceding the symptoms, and he has had nausea and diarrhea since then.  He reports he has a history of intermittent diarrhea, but has never had this much profuse diarrhea.  He reports that it is only liquid that appears clear coming out now in his bowel movements.  He reports poor appetite, has had difficulty eating or drinking anything due to nausea after he eats or drinks.  His doctor was concerned he may be dehydrated and advised to come to the ER.  He reports a history of a cholecystectomy, no other abdominal surgery.  He denies any antibiotic usage in the past 3 months, travel abroad, or history of C. difficile.  HPI     Home Medications Prior to Admission medications   Medication Sig Start Date End Date Taking? Authorizing Provider  dicyclomine (BENTYL) 20 MG tablet Take 1 tablet (20 mg total) by mouth 2 (two) times daily as needed for up to 20 doses for spasms. 04/30/21  Yes Yalonda Sample, Carola Rhine, MD  hydrOXYzine (ATARAX) 25 MG tablet Take 1 tablet (25 mg total) by mouth every 8 (eight) hours as needed for up to 21 doses. 04/30/21  Yes Wyvonnia Dusky, MD  ondansetron (ZOFRAN-ODT) 4 MG disintegrating tablet Take 1 tablet (4 mg total) by mouth every 8 (eight) hours as needed for up to 12 doses for nausea or vomiting. 04/30/21  Yes Wyvonnia Dusky, MD  ibuprofen (ADVIL) 200 MG tablet Take 400-600 mg by mouth every 6 (six) hours as needed for headache, mild pain, moderate pain or cramping.    [provider]      Allergies    Patient has no known allergies.    Review of Systems   Review of Systems  Physical Exam Updated Vital  Signs BP 118/81    Pulse 67    Temp 98.1 F (36.7 C)    Resp 18    Ht 6' (1.829 m)    Wt 81.2 kg    SpO2 99%    BMI 24.28 kg/m  Physical Exam Constitutional:      General: He is not in acute distress. HENT:     Head: Normocephalic and atraumatic.  Eyes:     Conjunctiva/sclera: Conjunctivae normal.     Pupils: Pupils are equal, round, and reactive to light.  Cardiovascular:     Rate and Rhythm: Normal rate and regular rhythm.  Pulmonary:     Effort: Pulmonary effort is normal. No respiratory distress.  Abdominal:     General: There is no distension.     Tenderness: There is no abdominal tenderness. There is no guarding or rebound.  Skin:    General: Skin is warm and dry.  Neurological:     General: No focal deficit present.     Mental Status: He is alert. Mental status is at baseline.  Psychiatric:        Mood and Affect: Mood normal.        Behavior: Behavior normal.    ED Results / Procedures / Treatments   Labs (all labs ordered are listed, but only abnormal results are displayed)  Labs Reviewed  COMPREHENSIVE METABOLIC PANEL - Abnormal; Notable for the following components:      Result Value   Glucose, Bld 107 (*)    AST 13 (*)    All other components within normal limits  C DIFFICILE QUICK SCREEN W PCR REFLEX    LIPASE, BLOOD  CBC  URINALYSIS, ROUTINE W REFLEX MICROSCOPIC  MAGNESIUM  DIFFERENTIAL    EKG None  Radiology CT ABDOMEN PELVIS W CONTRAST  Result Date: 04/30/2021 CLINICAL DATA:  Nausea/vomiting Diarrhea persistent x 1 week, nausea/vomiting - evaluate for colitis - hx of cholecystectomy EXAM: CT ABDOMEN AND PELVIS WITH CONTRAST TECHNIQUE: Multidetector CT imaging of the abdomen and pelvis was performed using the standard protocol following bolus administration of intravenous contrast. RADIATION DOSE REDUCTION: This exam was performed according to the departmental dose-optimization program which includes automated exposure control, adjustment of the mA  and/or kV according to patient size and/or use of iterative reconstruction technique. CONTRAST:  12mL OMNIPAQUE IOHEXOL 300 MG/ML  SOLN COMPARISON:  CT 05/08/2007 FINDINGS: Lower chest: Bibasilar hypoventilatory changes. No acute abnormality. Hepatobiliary: No focal liver abnormality is seen. Prior cholecystectomy with expected prominence of the biliary system. Pancreas: Unremarkable. No pancreatic ductal dilatation or surrounding inflammatory changes. Spleen: Normal in size without focal abnormality. Adrenals/Urinary Tract: Adrenal glands are unremarkable. No hydronephrosis or nephrolithiasis. There are multiple bilateral renal cysts, density consistent with simple cysts. Bladder is unremarkable. Stomach/Bowel: The stomach is within normal limits. There is no evidence of bowel obstruction.The appendix is normal. Scattered colonic diverticula. Vascular/Lymphatic: Scattered aortoiliac atherosclerotic calcifications. No AAA. No lymphadenopathy. Reproductive: Unremarkable. Other: Small fat containing left inguinal hernia. No bowel containing hernia. No abdominopelvic ascites. No free air. Musculoskeletal: No acute osseous abnormality. Multilevel degenerative changes of the spine. Trace retrolisthesis at L1-L2. Moderate disc height loss at L5-S1. No suspicious lytic or blastic lesions. Bilateral SI joint and hip osteoarthritis. IMPRESSION: No acute abdominopelvic abnormality.  Normal appendix. Diverticulosis.  No evidence of diverticulitis. Electronically Signed   By: Maurine Simmering M.D.   On: 04/30/2021 12:15    Procedures Procedures    Medications Ordered in ED Medications  sodium chloride 0.9 % bolus 1,000 mL (1,000 mLs Intravenous New Bag/Given 04/30/21 1121)  ondansetron (ZOFRAN) injection 4 mg (4 mg Intravenous Given 04/30/21 1121)  iohexol (OMNIPAQUE) 300 MG/ML solution 100 mL (100 mLs Intravenous Contrast Given 04/30/21 1157)    ED Course/ Medical Decision Making/ A&P Clinical Course as of 04/30/21 1700   Fri Apr 30, 2021  1234 IMPRESSION: No acute abdominopelvic abnormality.  Normal appendix.   Diverticulosis.  No evidence of diverticulitis. [MT]  1314 Patient does not feel he can provide stool sample here in the ED for culture testing.  I strongly encouraged him to follow-up with his doctor for this, as this is the remaining part of the immediate work-up that he should have completed.  I would not start him on empiric antibiotics given that this would raise the risk of C. difficile or improper treatment for possible C. difficile.  I will prescribe Zofran and Bentyl for his bloating and diarrhea cramping and nausea.  He is in agreement with this plan.  He has also asked for a small extension of his Atarax prescription for anxiety, which is reasonable. [MT]    Clinical Course User Index [MT] Sheniece Ruggles, Carola Rhine, MD                           Medical Decision Making  Amount and/or Complexity of Data Reviewed Labs: ordered. Radiology: ordered.  Risk Prescription drug management.   This patient presents to the Emergency Department with complaint of abdominal pain. This involves an extensive number of treatment options, and is a complaint that carries with it a high risk of complications and morbidity.  The differential diagnosis includes, but is not limited to, gastritis vs biliary disease vs peptic ulcer vs constipation vs colitis vs UTI vs other  I ordered, reviewed, and interpreted labs, including CMP and CBC.  There were no immediate, life-threatening emergencies found in this labwork.  Patient's UA showed no signs of infection I ordered medication IV fluids for hydration I ordered imaging studies which included CT abdomen pelvis I independently visualized and interpreted imaging which showed no acute findings to explain patient's symptoms and the monitor tracing which showed NSR External records obtained and reviewed showing outpatient office referral to the ED for evaluation for concerns  for possible dehydration.  I do not feel that this patient's presentation was consistent with AAA or ACS/STEMI  After the interventions stated above, I reevaluated the patient and found that they remained clinically stable.  Based on the patient's clinical exam, vital signs, risk factors, and ED testing, I felt that the patient's overall risk of life-threatening emergency such as bowel perforation, surgical emergency, or sepsis was quite low.  I suspect this clinical presentation is most consistent with nonspecific diarrhea/bloating, but explained to the patient that this evaluation was not a definitive diagnostic workup. See ED course.         Final Clinical Impression(s) / ED Diagnoses Final diagnoses:  Nausea vomiting and diarrhea  Abdominal bloating    Rx / DC Orders ED Discharge Orders          Ordered    ondansetron (ZOFRAN-ODT) 4 MG disintegrating tablet  Every 8 hours PRN        04/30/21 1316    dicyclomine (BENTYL) 20 MG tablet  2 times daily PRN        04/30/21 1316    hydrOXYzine (ATARAX) 25 MG tablet  Every 8 hours PRN        04/30/21 1316              Markese Bloxham, Carola Rhine, MD 04/30/21 1701

## 2021-05-12 ENCOUNTER — Telehealth (INDEPENDENT_AMBULATORY_CARE_PROVIDER_SITE_OTHER): Payer: Self-pay | Admitting: *Deleted

## 2021-05-12 NOTE — Telephone Encounter (Signed)
Copied from [place] [id number]. Topic: Appointment Scheduling - Scheduling Inquiry for Clinic >> May 11, 2021  3:18 PM Valere Dross wrote: Reason for CRM: Pt called in wanting to cancel appt for 02/16 with social worker Ashante McCoy. Please advise.

## 2021-05-13 ENCOUNTER — Institutional Professional Consult (permissible substitution) (INDEPENDENT_AMBULATORY_CARE_PROVIDER_SITE_OTHER): Payer: Self-pay | Admitting: Clinical

## 2021-05-13 ENCOUNTER — Ambulatory Visit (INDEPENDENT_AMBULATORY_CARE_PROVIDER_SITE_OTHER): Payer: Self-pay | Admitting: Primary Care

## 2021-06-21 ENCOUNTER — Telehealth (INDEPENDENT_AMBULATORY_CARE_PROVIDER_SITE_OTHER): Payer: Self-pay | Admitting: Primary Care

## 2021-06-21 NOTE — Telephone Encounter (Signed)
[place], [place], [phone] is calling because she mailed an application for financial assistance on 05/27/21 wanting to know was the application received and what is the status of the application. ?

## 2021-09-09 ENCOUNTER — Other Ambulatory Visit: Payer: Self-pay

## 2021-09-09 ENCOUNTER — Emergency Department (HOSPITAL_BASED_OUTPATIENT_CLINIC_OR_DEPARTMENT_OTHER)
Admission: EM | Admit: 2021-09-09 | Discharge: 2021-09-09 | Discharge status: Against Medical Advice | Payer: Self-pay | Attending: Emergency Medicine | Admitting: Emergency Medicine

## 2021-09-09 ENCOUNTER — Encounter (HOSPITAL_BASED_OUTPATIENT_CLINIC_OR_DEPARTMENT_OTHER): Payer: Self-pay | Admitting: Emergency Medicine

## 2021-09-09 DIAGNOSIS — Z5321 Procedure and treatment not carried out due to patient leaving prior to being seen by health care provider: Secondary | ICD-10-CM | POA: Insufficient documentation

## 2021-09-09 DIAGNOSIS — T23071A Burn of unspecified degree of right wrist, initial encounter: Secondary | ICD-10-CM | POA: Insufficient documentation
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
## 2021-10-04 ENCOUNTER — Telehealth: Payer: Self-pay | Admitting: *Deleted

## 2021-10-04 ENCOUNTER — Ambulatory Visit (INDEPENDENT_AMBULATORY_CARE_PROVIDER_SITE_OTHER): Payer: Self-pay | Admitting: Physician Assistant

## 2021-10-04 ENCOUNTER — Encounter: Payer: Self-pay | Admitting: Physician Assistant

## 2021-10-04 VITALS — BP 110/70 | HR 76 | Temp 98.4°F | Ht 70.5 in | Wt 173.4 lb

## 2021-10-04 DIAGNOSIS — F1911 Other psychoactive substance abuse, in remission: Secondary | ICD-10-CM

## 2021-10-04 DIAGNOSIS — F17210 Nicotine dependence, cigarettes, uncomplicated: Secondary | ICD-10-CM

## 2021-10-04 DIAGNOSIS — Z125 Encounter for screening for malignant neoplasm of prostate: Secondary | ICD-10-CM

## 2021-10-04 DIAGNOSIS — Z1211 Encounter for screening for malignant neoplasm of colon: Secondary | ICD-10-CM

## 2021-10-04 DIAGNOSIS — R634 Abnormal weight loss: Secondary | ICD-10-CM

## 2021-10-04 DIAGNOSIS — T148XXD Other injury of unspecified body region, subsequent encounter: Secondary | ICD-10-CM

## 2021-10-04 DIAGNOSIS — F418 Other specified anxiety disorders: Secondary | ICD-10-CM

## 2021-10-04 NOTE — Telephone Encounter (Signed)
Left message on voicemail to call office.  

## 2021-10-04 NOTE — Addendum Note (Signed)
Addended by: Erlene Quan on: 10/04/2021 04:16 PM   Modules accepted: Orders

## 2021-10-04 NOTE — Patient Instructions (Signed)
It was great to see you!  Please call GI to see if you can get your colonoscopy scheduled before September 1st (explain your financial situation to them when you call)  [phone]  I will reach out to my addiction counselor I refer to to see if she can help you  Please continue to reach out to [facility]  I have ordered a CT scan of your chest and an ultrasound of your heart  Let's follow-up in 2 weeks to check in on your mental health, weight and if we need to do more blood work and testing , sooner if you have concerns.  If a referral was placed today --> you will be contacted for an appointment. Please note that routine referrals can sometimes take up to 3-4 weeks to process. Please call our office if you haven't heard anything after this time frame.  If blood work, urine studies, or any imaging was ordered today -->  we will release your results to you on your MyChart account (if you have chosen to sign up for this) with further instructions. You may see the results before I do, but when I review them I will send you a message with my report or have my staff call you if things need to be discussed. Please reply to my message with any questions.   Take care,  Inda Coke PA-C

## 2021-10-04 NOTE — Telephone Encounter (Signed)
Discussed with Aldona Bar, okay to increase Hydroxyzine to 50 mg every 8 hrs as needed. Also pt to use Aquaphor on wounds to keep moist and keep covered with bandage.
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
## 2021-10-04 NOTE — Telephone Encounter (Signed)
Pt asked on his way out if he could have refill on Hydroxyzine and would like it increased, also what to put on wounds. Told him I will discuss with Aldona Bar and get back to him. Pt verbalized understanding.

## 2021-10-05 LAB — CBC WITH DIFFERENTIAL/PLATELET
Basophils Absolute: 0 10*3/uL (ref 0.0–0.1)
Basophils Relative: 0.6 % (ref 0.0–3.0)
Eosinophils Absolute: 0.2 10*3/uL (ref 0.0–0.7)
Eosinophils Relative: 2.2 % (ref 0.0–5.0)
HCT: 45.2 % (ref 39.0–52.0)
Hemoglobin: 15.2 g/dL (ref 13.0–17.0)
Lymphocytes Relative: 26.7 % (ref 12.0–46.0)
Lymphs Abs: 2.2 10*3/uL (ref 0.7–4.0)
MCHC: 33.5 g/dL (ref 30.0–36.0)
MCV: 91.6 fl (ref 78.0–100.0)
Monocytes Absolute: 0.7 10*3/uL (ref 0.1–1.0)
Monocytes Relative: 8.9 % (ref 3.0–12.0)
Neutro Abs: 5.1 10*3/uL (ref 1.4–7.7)
Neutrophils Relative %: 61.6 % (ref 43.0–77.0)
Platelets: 234 10*3/uL (ref 150.0–400.0)
RBC: 4.94 Mil/uL (ref 4.22–5.81)
RDW: 13.4 % (ref 11.5–15.5)
WBC: 8.3 10*3/uL (ref 4.0–10.5)

## 2021-10-05 LAB — URINALYSIS, ROUTINE W REFLEX MICROSCOPIC
Bilirubin Urine: NEGATIVE
Hgb urine dipstick: NEGATIVE
Ketones, ur: NEGATIVE
Leukocytes,Ua: NEGATIVE
Nitrite: NEGATIVE
RBC / HPF: NONE SEEN (ref 0–?)
Specific Gravity, Urine: 1.02 (ref 1.000–1.030)
Total Protein, Urine: NEGATIVE
Urine Glucose: NEGATIVE
Urobilinogen, UA: 0.2 (ref 0.0–1.0)
pH: 7 (ref 5.0–8.0)

## 2021-10-05 LAB — COMPREHENSIVE METABOLIC PANEL
ALT: 20 U/L (ref 0–53)
AST: 14 U/L (ref 0–37)
Albumin: 4.6 g/dL (ref 3.5–5.2)
Alkaline Phosphatase: 48 U/L (ref 39–117)
BUN: 21 mg/dL (ref 6–23)
CO2: 26 mEq/L (ref 19–32)
Calcium: 9.4 mg/dL (ref 8.4–10.5)
Chloride: 100 mEq/L (ref 96–112)
Creatinine, Ser: 0.96 mg/dL (ref 0.40–1.50)
GFR: 85.63 mL/min (ref >60.00)
Glucose, Bld: 83 mg/dL (ref 70–99)
Potassium: 4.1 mEq/L (ref 3.5–5.1)
Sodium: 136 mEq/L (ref 135–145)
Total Bilirubin: 0.4 mg/dL (ref 0.2–1.2)
Total Protein: 7 g/dL (ref 6.0–8.3)

## 2021-10-05 LAB — PSA: PSA: 0.78 ng/mL (ref 0.10–4.00)

## 2021-10-05 MED ORDER — TRAZODONE HCL 100 MG PO TABS: 100.0000 mg | ORAL_TABLET | Freq: Every day | ORAL | 2 refills | Status: DC

## 2021-10-05 MED ORDER — HYDROXYZINE HCL 50 MG PO TABS: 50.0000 mg | ORAL_TABLET | Freq: Three times a day (TID) | ORAL | 2 refills | Status: DC | PRN

## 2021-10-05 NOTE — Telephone Encounter (Signed)
Spoke to pt told him Aldona Bar said okay to increase Hydroxyzine to 50 mg every 8 hrs as needed and also for your wounds she said to apply Aquaphor to them and cover with bandage. Pt verbalized understanding and said also need refill on Trazodone. Told him will send both Rx's to the pharmacy, clarified pharmacy. Pt verbalized understanding.

## 2021-10-07 ENCOUNTER — Ambulatory Visit (HOSPITAL_COMMUNITY): Payer: Self-pay | Attending: Physician Assistant

## 2021-10-07 DIAGNOSIS — R634 Abnormal weight loss: Secondary | ICD-10-CM | POA: Insufficient documentation

## 2021-10-07 LAB — HIV ANTIBODY (ROUTINE TESTING W REFLEX): HIV 1&2 Ab, 4th Generation: NONREACTIVE

## 2021-10-07 LAB — HCV RNA,QUANTITATIVE REAL TIME PCR
HCV Quantitative Log: <1.18 Log IU/mL
HCV RNA, PCR, QN: <15 IU/mL

## 2021-10-07 LAB — ANA: Anti Nuclear Antibody (ANA): POSITIVE — AB

## 2021-10-07 LAB — ECHOCARDIOGRAM COMPLETE
Area-P 1/2: 4.39 cm2
S' Lateral: 3.2 cm

## 2021-10-07 LAB — RPR: RPR Ser Ql: NONREACTIVE

## 2021-10-07 LAB — HEPATITIS C ANTIBODY: Hepatitis C Ab: REACTIVE — AB

## 2021-10-07 LAB — ANTI-NUCLEAR AB-TITER (ANA TITER): ANA Titer 1: 1:80 {titer} — ABNORMAL HIGH

## 2021-10-12 ENCOUNTER — Other Ambulatory Visit: Payer: Self-pay | Admitting: *Deleted

## 2021-10-12 DIAGNOSIS — Z1211 Encounter for screening for malignant neoplasm of colon: Secondary | ICD-10-CM

## 2021-10-12 DIAGNOSIS — R768 Other specified abnormal immunological findings in serum: Secondary | ICD-10-CM

## 2021-10-13 ENCOUNTER — Telehealth: Payer: Self-pay | Admitting: *Deleted

## 2021-10-13 NOTE — Telephone Encounter (Signed)
Please call patient --   [facility] reviewed his chart and have declined to see him.   One option is to try another rheumatologist, however, they will not be in the [facility] network.   Please see what patient prefers to do.   Aldona Bar

## 2021-10-13 NOTE — Telephone Encounter (Signed)
Left message on voicemail to call office.  

## 2021-10-14 ENCOUNTER — Other Ambulatory Visit: Payer: Self-pay

## 2021-10-14 ENCOUNTER — Encounter: Payer: Self-pay | Admitting: Internal Medicine

## 2021-10-14 DIAGNOSIS — F418 Other specified anxiety disorders: Secondary | ICD-10-CM

## 2021-10-14 NOTE — Telephone Encounter (Signed)
Patient has called back - Patient stated he cannot go to a non-[facility] provider due to him receiving financial aid thorough [facility].    He has not heard anything from [facility] - he was advised to call and sch an apt -   He has not heard from psychiatrist- stated if its note [facility] he cannot go.   Patient would like a call back with further information as his financial aid expires in September

## 2021-10-14 NOTE — Telephone Encounter (Signed)
I have placed a new internal referral for psychiatry. Patient is aware to call gastro to schedule. If [facility] has refused to see him, I am not sure what other options he has other than paying OOP for external rheumatologist.

## 2021-10-19 ENCOUNTER — Ambulatory Visit (INDEPENDENT_AMBULATORY_CARE_PROVIDER_SITE_OTHER): Payer: Self-pay | Admitting: Physician Assistant

## 2021-10-19 ENCOUNTER — Encounter: Payer: Self-pay | Admitting: Physician Assistant

## 2021-10-19 VITALS — BP 116/70 | HR 107 | Temp 97.9°F | Ht 70.5 in | Wt 174.8 lb

## 2021-10-19 DIAGNOSIS — F418 Other specified anxiety disorders: Secondary | ICD-10-CM

## 2021-10-19 DIAGNOSIS — R768 Other specified abnormal immunological findings in serum: Secondary | ICD-10-CM

## 2021-10-19 MED ORDER — TRAZODONE HCL 100 MG PO TABS: 100.0000 mg | ORAL_TABLET | Freq: Every day | ORAL | 1 refills | Status: DC

## 2021-10-19 MED ORDER — HYDROXYZINE HCL 50 MG PO TABS: 50.0000 mg | ORAL_TABLET | Freq: Three times a day (TID) | ORAL | 1 refills | Status: DC | PRN

## 2021-10-19 NOTE — Patient Instructions (Signed)
It was great to see you!  [facility] -- look into this  You can always message me for your health care needs and I will try to make thing  Bring your [facility] bill -- say Aldona Bar wanted to see this  Follow-up as need  Take care,  Inda Coke PA-C
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
## 2021-11-01 ENCOUNTER — Ambulatory Visit (HOSPITAL_COMMUNITY): Payer: Self-pay | Admitting: Student in an Organized Health Care Education/Training Program

## 2021-11-08 ENCOUNTER — Ambulatory Visit (HOSPITAL_BASED_OUTPATIENT_CLINIC_OR_DEPARTMENT_OTHER): Payer: Self-pay | Admitting: Student in an Organized Health Care Education/Training Program

## 2021-11-08 ENCOUNTER — Encounter (HOSPITAL_COMMUNITY): Payer: Self-pay | Admitting: Student in an Organized Health Care Education/Training Program

## 2021-11-08 DIAGNOSIS — F331 Major depressive disorder, recurrent, moderate: Secondary | ICD-10-CM

## 2021-11-08 DIAGNOSIS — F410 Panic disorder [episodic paroxysmal anxiety] without agoraphobia: Secondary | ICD-10-CM

## 2021-11-08 DIAGNOSIS — F411 Generalized anxiety disorder: Secondary | ICD-10-CM

## 2021-11-08 MED ORDER — ESCITALOPRAM OXALATE 10 MG PO TABS
10.0000 mg | ORAL_TABLET | Freq: Every day | ORAL | 1 refills | Status: DC
Start: 2021-11-08 — End: 2021-11-08

## 2021-11-08 MED ORDER — ESCITALOPRAM OXALATE 10 MG PO TABS: 10.0000 mg | ORAL_TABLET | Freq: Every day | ORAL | 1 refills | Status: DC
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
## 2021-12-07 ENCOUNTER — Ambulatory Visit (HOSPITAL_COMMUNITY): Payer: Self-pay | Admitting: Student in an Organized Health Care Education/Training Program

## 2022-06-19 ENCOUNTER — Other Ambulatory Visit: Payer: Self-pay

## 2022-06-19 ENCOUNTER — Emergency Department (HOSPITAL_BASED_OUTPATIENT_CLINIC_OR_DEPARTMENT_OTHER): Payer: Self-pay

## 2022-06-19 ENCOUNTER — Emergency Department (HOSPITAL_BASED_OUTPATIENT_CLINIC_OR_DEPARTMENT_OTHER)
Admission: EM | Admit: 2022-06-19 | Discharge: 2022-06-19 | Disposition: A | Discharge status: Home / Self Care | Payer: Self-pay | Attending: Emergency Medicine | Admitting: Emergency Medicine

## 2022-06-19 DIAGNOSIS — R000 Tachycardia, unspecified: Secondary | ICD-10-CM | POA: Insufficient documentation

## 2022-06-19 DIAGNOSIS — K610 Anal abscess: Secondary | ICD-10-CM

## 2022-06-19 DIAGNOSIS — K6289 Other specified diseases of anus and rectum: Secondary | ICD-10-CM | POA: Insufficient documentation

## 2022-06-19 LAB — CBC WITH DIFFERENTIAL/PLATELET
Abs Immature Granulocytes: 0.07 10*3/uL (ref 0.00–0.07)
Basophils Absolute: 0 10*3/uL (ref 0.0–0.1)
Basophils Relative: 0 %
Eosinophils Absolute: 0.2 10*3/uL (ref 0.0–0.5)
Eosinophils Relative: 1 %
HCT: 46.3 % (ref 39.0–52.0)
Hemoglobin: 15.5 g/dL (ref 13.0–17.0)
Immature Granulocytes: 1 %
Lymphocytes Relative: 12 %
Lymphs Abs: 1.7 10*3/uL (ref 0.7–4.0)
MCH: 30.6 pg (ref 26.0–34.0)
MCHC: 33.5 g/dL (ref 30.0–36.0)
MCV: 91.5 fL (ref 80.0–100.0)
Monocytes Absolute: 1.3 10*3/uL — ABNORMAL HIGH (ref 0.1–1.0)
Monocytes Relative: 9 %
Neutro Abs: 11.7 10*3/uL — ABNORMAL HIGH (ref 1.7–7.7)
Neutrophils Relative %: 77 %
Platelets: 223 10*3/uL (ref 150–400)
RBC: 5.06 MIL/uL (ref 4.22–5.81)
RDW: 13.2 % (ref 11.5–15.5)
WBC: 15.1 10*3/uL — ABNORMAL HIGH (ref 4.0–10.5)
nRBC: 0 % (ref 0.0–0.2)

## 2022-06-19 LAB — COMPREHENSIVE METABOLIC PANEL
ALT: 13 U/L (ref 0–44)
AST: 11 U/L — ABNORMAL LOW (ref 15–41)
Albumin: 4.5 g/dL (ref 3.5–5.0)
Alkaline Phosphatase: 56 U/L (ref 38–126)
Anion gap: 6 (ref 5–15)
BUN: 22 mg/dL (ref 8–23)
CO2: 30 mmol/L (ref 22–32)
Calcium: 9.6 mg/dL (ref 8.9–10.3)
Chloride: 102 mmol/L (ref 98–111)
Creatinine, Ser: 0.77 mg/dL (ref 0.61–1.24)
GFR, Estimated: >60 mL/min (ref >60)
Glucose, Bld: 122 mg/dL — ABNORMAL HIGH (ref 70–99)
Potassium: 4.3 mmol/L (ref 3.5–5.1)
Sodium: 138 mmol/L (ref 135–145)
Total Bilirubin: 0.4 mg/dL (ref 0.3–1.2)
Total Protein: 7.4 g/dL (ref 6.5–8.1)

## 2022-06-19 LAB — OCCULT BLOOD X 1 CARD TO LAB, STOOL: Fecal Occult Bld: NEGATIVE

## 2022-06-19 MED ORDER — PIPERACILLIN-TAZOBACTAM 3.375 G IVPB 30 MIN
3.3750 g | Freq: Once | INTRAVENOUS | Status: AC
  Administered 2022-06-19: 3.375 g via INTRAVENOUS
  Filled 2022-06-19: qty 50

## 2022-06-19 MED ORDER — IOHEXOL 300 MG/ML  SOLN
100.0000 mL | Freq: Once | INTRAMUSCULAR | Status: AC | PRN
  Administered 2022-06-19: 80 mL via INTRAVENOUS

## 2022-06-19 MED ORDER — KETOROLAC TROMETHAMINE 30 MG/ML IJ SOLN
30.0000 mg | Freq: Once | INTRAMUSCULAR | Status: AC
  Administered 2022-06-19: 30 mg via INTRAVENOUS

## 2022-06-19 MED ORDER — LIDOCAINE-EPINEPHRINE-TETRACAINE (LET) TOPICAL GEL
3.0000 mL | Freq: Once | TOPICAL | Status: AC
  Administered 2022-06-19: 3 mL via TOPICAL
  Filled 2022-06-19: qty 3

## 2022-06-19 MED ORDER — FENTANYL CITRATE PF 50 MCG/ML IJ SOSY
50.0000 ug | PREFILLED_SYRINGE | Freq: Once | INTRAMUSCULAR | Status: AC
  Administered 2022-06-19: 50 ug via INTRAVENOUS
  Filled 2022-06-19: qty 1

## 2022-06-19 MED ORDER — LIDOCAINE HCL (PF) 1 % IJ SOLN
INTRAMUSCULAR | Status: AC
  Filled 2022-06-19: qty 5

## 2022-06-19 MED ORDER — HYDROXYZINE HCL 25 MG PO TABS: 50.0000 mg | ORAL_TABLET | Freq: Four times a day (QID) | ORAL | 0 refills | Status: DC | PRN

## 2022-06-19 MED ORDER — LIDOCAINE-EPINEPHRINE 2 %-1:100000 IJ SOLN: 10.0000 mL | Freq: Once | INTRAMUSCULAR | Status: DC

## 2022-06-19 MED ORDER — CLINDAMYCIN HCL 150 MG PO CAPS: 300.0000 mg | ORAL_CAPSULE | Freq: Three times a day (TID) | ORAL | 0 refills | Status: AC

## 2022-06-19 MED ORDER — OXYCODONE HCL 5 MG PO TABS: 5.0000 mg | ORAL_TABLET | ORAL | 0 refills | Status: DC | PRN

## 2022-06-19 MED ORDER — LORAZEPAM 1 MG PO TABS
1.0000 mg | ORAL_TABLET | Freq: Once | ORAL | Status: AC
  Administered 2022-06-19: 1 mg via ORAL
  Filled 2022-06-19: qty 1

## 2022-06-19 MED ORDER — ONDANSETRON HCL 4 MG/2ML IJ SOLN
4.0000 mg | Freq: Once | INTRAMUSCULAR | Status: AC
  Administered 2022-06-19: 4 mg via INTRAVENOUS
  Filled 2022-06-19: qty 2

## 2022-06-19 MED ORDER — OXYCODONE HCL 5 MG PO TABS
5.0000 mg | ORAL_TABLET | Freq: Once | ORAL | Status: AC
  Administered 2022-06-19: 5 mg via ORAL
  Filled 2022-06-19: qty 1

## 2022-06-19 MED ORDER — LACTATED RINGERS IV BOLUS
1000.0000 mL | Freq: Once | INTRAVENOUS | Status: AC
  Administered 2022-06-19: 1000 mL via INTRAVENOUS

## 2022-06-19 MED ORDER — CLINDAMYCIN HCL 150 MG PO CAPS
300.0000 mg | ORAL_CAPSULE | Freq: Once | ORAL | Status: AC
  Administered 2022-06-19: 300 mg via ORAL
  Filled 2022-06-19: qty 2

## 2022-06-19 MED ORDER — LORAZEPAM 1 MG PO TABS
0.5000 mg | ORAL_TABLET | Freq: Once | ORAL | Status: AC
  Administered 2022-06-19: 0.5 mg via ORAL
  Filled 2022-06-19: qty 1

## 2022-06-19 MED ORDER — LIDOCAINE-EPINEPHRINE (PF) 2 %-1:200000 IJ SOLN
INTRAMUSCULAR | Status: AC
  Administered 2022-06-19: 20 mL
  Filled 2022-06-19: qty 20

## 2022-06-19 NOTE — ED Provider Notes (Signed)
  Physical Exam  BP (!) 131/90 (BP Location: Right Arm)   Pulse (!) 108   Temp 98 F (36.7 C) (Oral)   Resp 18   Ht 5\' 10"  (1.778 m)   Wt 79.3 kg   SpO2 98%   BMI 25.08 kg/m   Physical Exam  Procedures  Procedures  ED Course / MDM    Medical Decision Making Amount and/or Complexity of Data Reviewed Labs: ordered. Radiology: ordered.  Risk Prescription drug management.   ***

## 2022-06-19 NOTE — ED Notes (Signed)
Patient appears very anxious and is pacing room. Provider notified.

## 2022-06-19 NOTE — ED Provider Notes (Signed)
[facility] Provider Note   CSN: [id number] Arrival date & time: 06/19/22  1414     History  Chief Complaint  Patient presents with   Rectal Pain    Todd Choi is a 62 y.o. male.  Patient with a history of depression and anxiety, previous alcohol abuse presenting with severe rectal pain for the past 3 days.  States pain started after having a "very hard bowel movement" 3 days ago  and has been having severe pain ever since.  Still having bowel movements but pain worsens with defecation.  Pain is constant, unrelieved by topical diaper ointment and Epsom salt baths.  Has not seen any bleeding or any black or bloody stools.  Has not had any fever, chills, nausea or vomiting.  No abdominal pain.  Tachycardic to the 130s on arrival.  Does admit to using cocaine 3 to 4 days ago.  States no history of alcohol withdrawal and has been clean from alcohol for many years.  No previous abdominal surgeries.  No history of hemorrhoids.  The history is provided by the patient.       Home Medications Prior to Admission medications   Medication Sig Start Date End Date Taking? Authorizing Provider  dicyclomine (BENTYL) 20 MG tablet Take 1 tablet (20 mg total) by mouth 2 (two) times daily as needed for up to 20 doses for spasms. 04/30/21   Wyvonnia Dusky, MD  escitalopram (LEXAPRO) 10 MG tablet Take 1 tablet (10 mg total) by mouth daily. Take a half tablet (5 mg) daily for 8 days then take a whole tablet (10 mg) daily 11/08/21   Briant Cedar, MD  hydrOXYzine (ATARAX) 50 MG tablet Take 1 tablet (50 mg total) by mouth every 8 (eight) hours as needed. 10/19/21   Inda Coke, PA  ibuprofen (ADVIL) 200 MG tablet Take 400-600 mg by mouth every 6 (six) hours as needed for headache, mild pain, moderate pain or cramping.    [provider]  ondansetron (ZOFRAN-ODT) 4 MG disintegrating tablet Take 1 tablet (4 mg total) by mouth every 8 (eight)  hours as needed for up to 12 doses for nausea or vomiting. 04/30/21   Wyvonnia Dusky, MD  traZODone (DESYREL) 100 MG tablet Take 1 tablet (100 mg total) by mouth at bedtime. 10/19/21   Inda Coke, PA      Allergies    Patient has no known allergies.    Review of Systems   Review of Systems  Constitutional:  Negative for activity change, appetite change and fever.  HENT:  Negative for congestion.   Respiratory:  Negative for cough, chest tightness and shortness of breath.   Gastrointestinal:  Positive for rectal pain. Negative for abdominal pain, blood in stool and vomiting.  Genitourinary:  Negative for dysuria and hematuria.  Musculoskeletal:  Negative for arthralgias and myalgias.  Skin:  Negative for rash.  Neurological:  Negative for dizziness, weakness and headaches.   all other systems are negative except as noted in the HPI and PMH.    Physical Exam Updated Vital Signs BP (!) 134/99 (BP Location: Right Arm)   Pulse (!) 133   Temp 98 F (36.7 C)   Resp 18   Ht 5\' 10"  (1.778 m)   Wt 79.3 kg   SpO2 96%   BMI 25.08 kg/m  Physical Exam Vitals and nursing note reviewed.  Constitutional:      General: He is not in acute distress.  Appearance: He is well-developed.     Comments: Anxious appearing  HENT:     Head: Normocephalic and atraumatic.     Mouth/Throat:     Pharynx: No oropharyngeal exudate.  Eyes:     Conjunctiva/sclera: Conjunctivae normal.     Pupils: Pupils are equal, round, and reactive to light.  Neck:     Comments: No meningismus. Cardiovascular:     Rate and Rhythm: Regular rhythm. Tachycardia present.     Heart sounds: Normal heart sounds. No murmur heard.    Comments: Tachycardic 130s Pulmonary:     Effort: Pulmonary effort is normal. No respiratory distress.     Breath sounds: Normal breath sounds.  Abdominal:     Palpations: Abdomen is soft.     Tenderness: There is no abdominal tenderness. There is no guarding or rebound.   Genitourinary:    Comments: No external hemorrhoids visualized.  No fissures.  There is firm induration to the 12 o'clock position of the anus approximately 2 cm.  No fluctuance. Severe tenderness with digital rectal exam near the 12 o'clock position with some fluctuance. Musculoskeletal:        General: No tenderness. Normal range of motion.     Cervical back: Normal range of motion and neck supple.  Skin:    General: Skin is warm.  Neurological:     Mental Status: He is alert and oriented to person, place, and time.     Cranial Nerves: No cranial nerve deficit.     Motor: No abnormal muscle tone.     Coordination: Coordination normal.     Comments:  5/5 strength throughout. CN 2-12 intact.Equal grip strength.   Psychiatric:        Behavior: Behavior normal.     ED Results / Procedures / Treatments   Labs (all labs ordered are listed, but only abnormal results are displayed) Labs Reviewed  CBC WITH DIFFERENTIAL/PLATELET - Abnormal; Notable for the following components:      Result Value   WBC 15.1 (*)    Neutro Abs 11.7 (*)    Monocytes Absolute 1.3 (*)    All other components within normal limits  COMPREHENSIVE METABOLIC PANEL - Abnormal; Notable for the following components:   Glucose, Bld 122 (*)    AST 11 (*)    All other components within normal limits  OCCULT BLOOD X 1 CARD TO LAB, STOOL  POC OCCULT BLOOD, ED    EKG None  Radiology No results found.  Procedures Procedures    Medications Ordered in ED Medications  lactated ringers bolus 1,000 mL (has no administration in time range)  fentaNYL (SUBLIMAZE) injection 50 mcg (has no administration in time range)  ondansetron (ZOFRAN) injection 4 mg (has no administration in time range)    ED Course/ Medical Decision Making/ A&P                             Medical Decision Making Amount and/or Complexity of Data Reviewed Labs: ordered. Decision-making details documented in ED Course. Radiology: ordered  and independent interpretation performed. Decision-making details documented in ED Course. ECG/medicine tests: ordered and independent interpretation performed. Decision-making details documented in ED Course.  Risk Prescription drug management.   Rectal pain for 3 days after having a hard bowel movement and straining.  No bleeding.  Tachycardic on arrival and very anxious appearing.  Exam with some induration at the 12 o'clock position but no discrete fluctuance externally.  On internal exam there is some questionable fluctuance of CT scan will be obtained.  IVF given, labs to be obtained.  WBC 15.   CT pending at shift change to assess for perirectal abscess. If negative, will likely need antibiotics for suspected early perianal abscess.  Dr. Billy Fischer to assume care.        Final Clinical Impression(s) / ED Diagnoses Final diagnoses:  None    Rx / DC Orders ED Discharge Orders     None         Bethanny Toelle, Annie Main, MD 06/19/22 1645

## 2022-06-19 NOTE — ED Triage Notes (Signed)
Patient arrives ambulatory to ED with complaints of rectal pain x3 days. Patient reports increased pain with bowel movements. Rates pain 9/10.

## 2022-06-19 NOTE — ED Notes (Signed)
Patient requesting additional pain medication. Provider notified.

## 2022-07-14 IMAGING — CT CT ABD-PELV W/ CM
2 of 5 series · 15 of 46 positions shown, 17 images · IV contrast (APPLIED)
Comparison: CT 05/08/2007

CLINICAL DATA: Nausea/vomiting Diarrhea persistent x 1 week,
nausea/vomiting - evaluate for colitis - hx of cholecystectomy

EXAM:
CT ABDOMEN AND PELVIS WITH CONTRAST
TECHNIQUE: Multidetector CT imaging of the abdomen and pelvis was performed
using the standard protocol following bolus administration of
intravenous contrast.

[Series 2: abd pel w · axial · 0.78mm/px · z∈[-356,-12]mm · 12 of 83 slices shown, 14 images]
[im 7/83  soft-tissue]
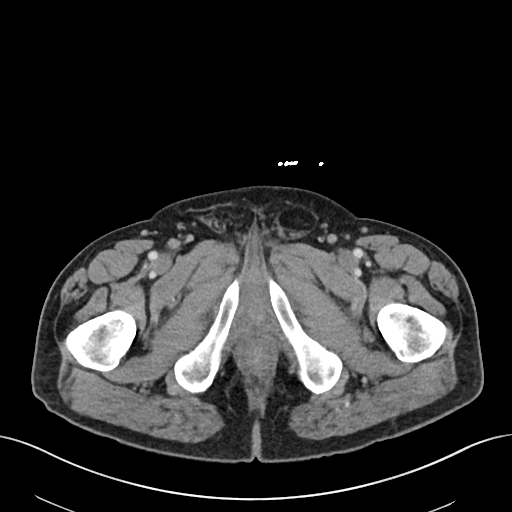
[im 7/83  bone]
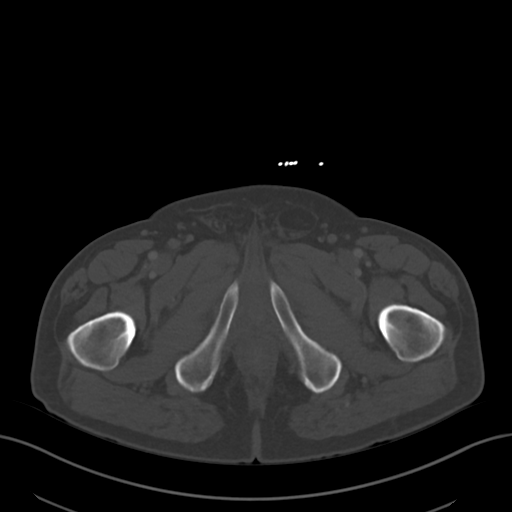
[im 13/83  soft-tissue]
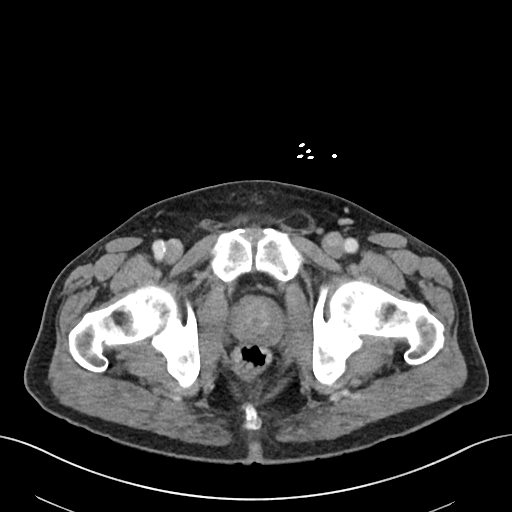
[im 19/83  soft-tissue]
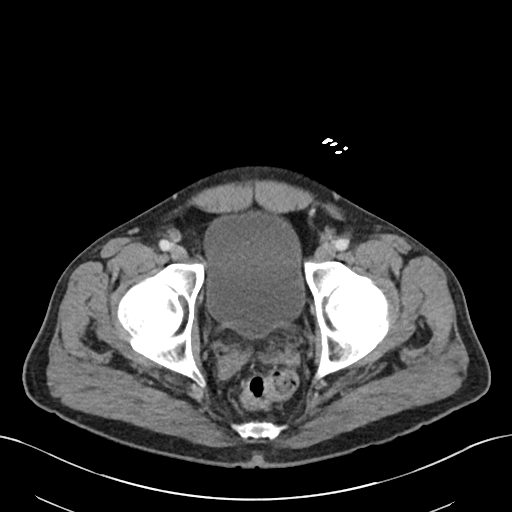
[im 26/83  soft-tissue]
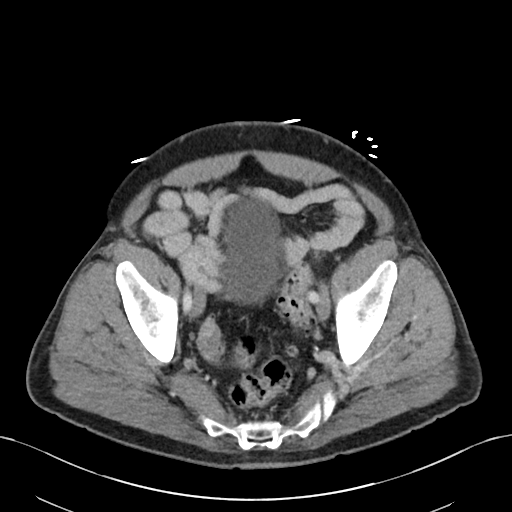
[im 32/83  soft-tissue]
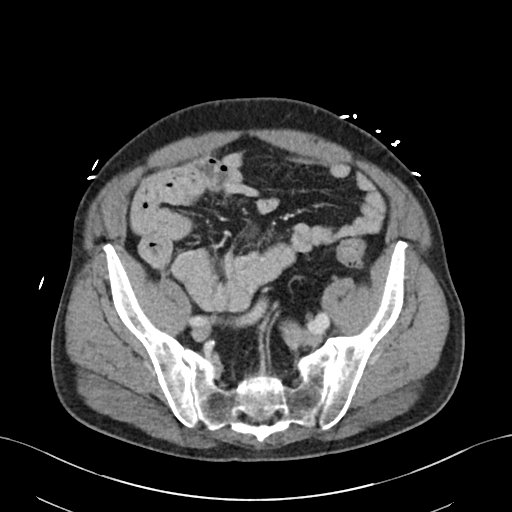
[im 38/83  soft-tissue]
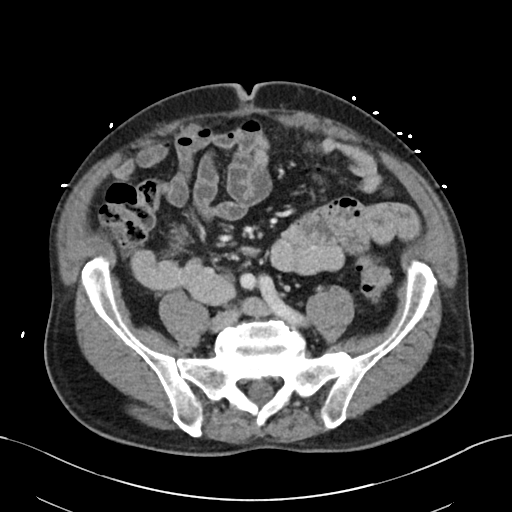
[im 45/83  soft-tissue]
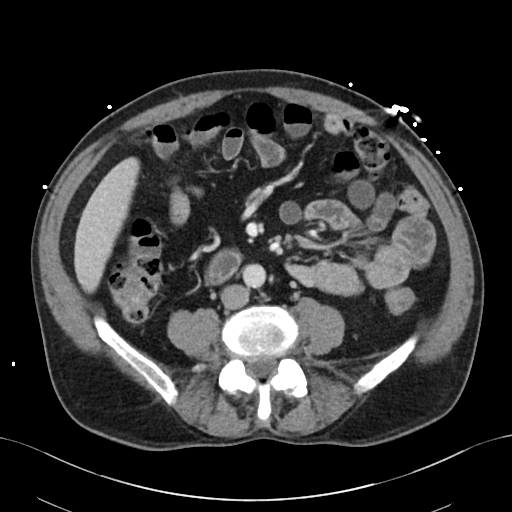
[im 51/83  soft-tissue]
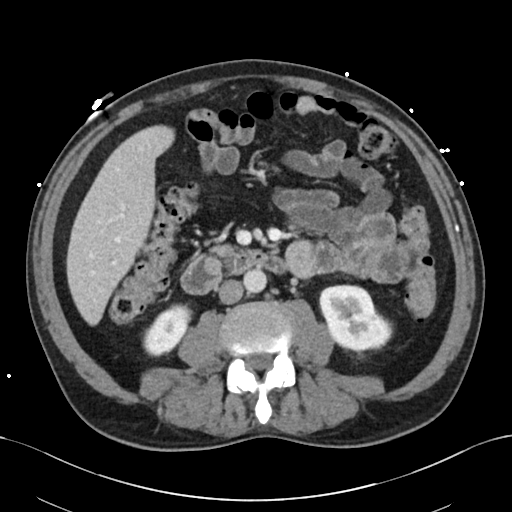
[im 57/83  soft-tissue]
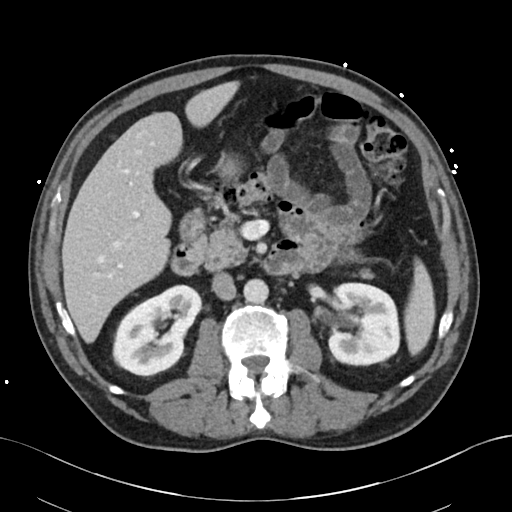
[im 57/83  bone]
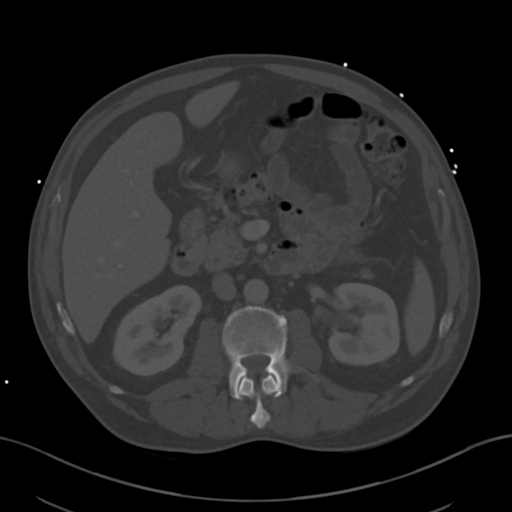
[im 64/83  soft-tissue]
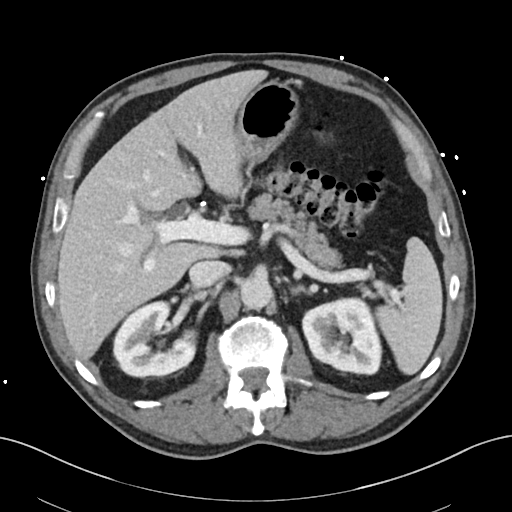
[im 70/83  soft-tissue]
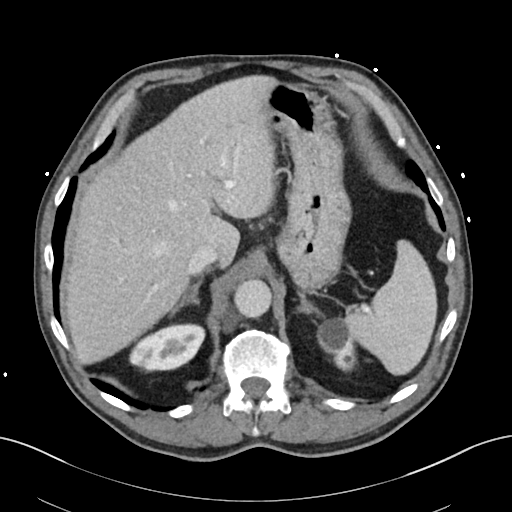
[im 76/83  soft-tissue]
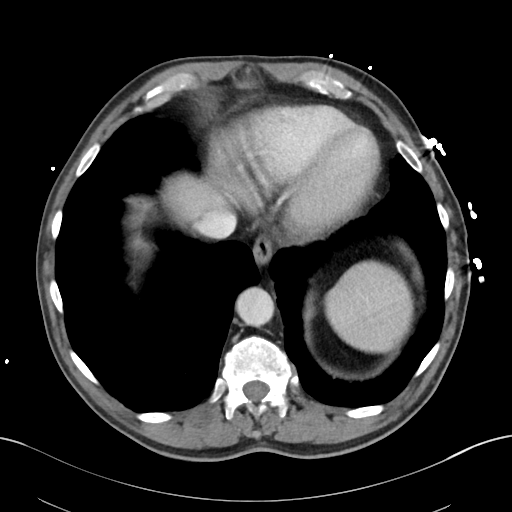

[Series 6: coronal · coronal · 0.82mm/px · 3 of 110 slices shown]
[im 37/110  soft-tissue]
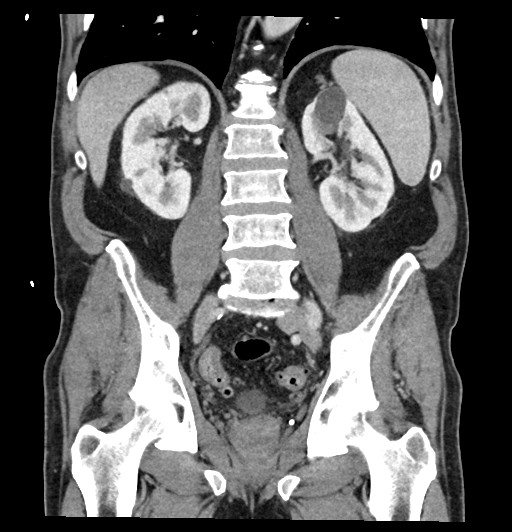
[im 49/110  soft-tissue]
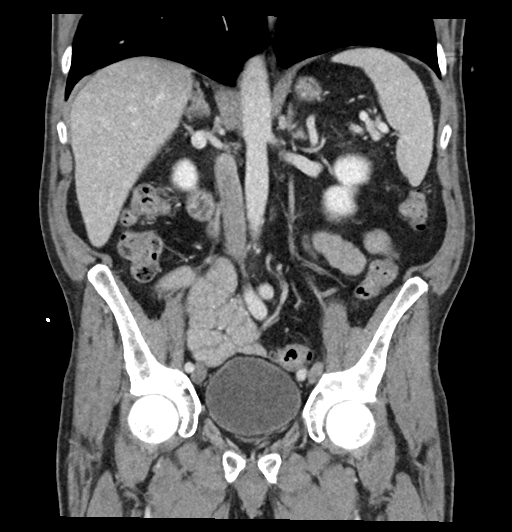
[im 61/110  soft-tissue]
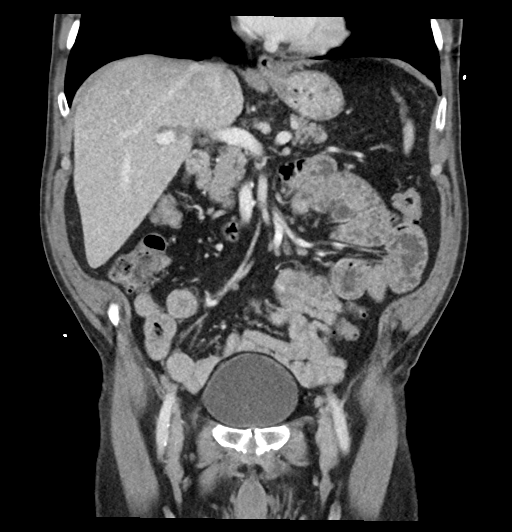

[15 of 46 positions shown; findings below may reference images not displayed]

RADIATION DOSE REDUCTION: This exam was performed according to the
departmental dose-optimization program which includes automated
exposure control, adjustment of the mA and/or kV according to
patient size and/or use of iterative reconstruction technique.

CONTRAST:  100mL OMNIPAQUE IOHEXOL 300 MG/ML  SOLN
FINDINGS: Lower chest: Bibasilar hypoventilatory changes. No acute
abnormality.

Hepatobiliary: No focal liver abnormality is seen. Prior
cholecystectomy with expected prominence of the biliary system.

Pancreas: Unremarkable. No pancreatic ductal dilatation or
surrounding inflammatory changes.

Spleen: Normal in size without focal abnormality.

Adrenals/Urinary Tract: Adrenal glands are unremarkable. No
hydronephrosis or nephrolithiasis. There are multiple bilateral
renal cysts, density consistent with simple cysts. Bladder is
unremarkable.

Stomach/Bowel: The stomach is within normal limits. There is no
evidence of bowel obstruction.The appendix is normal. Scattered
colonic diverticula.

Vascular/Lymphatic: Scattered aortoiliac atherosclerotic
calcifications. No AAA. No lymphadenopathy.

Reproductive: Unremarkable.

Other: Small fat containing left inguinal hernia. No bowel
containing hernia. No abdominopelvic ascites. No free air.

Musculoskeletal: No acute osseous abnormality. Multilevel
degenerative changes of the spine. Trace retrolisthesis at L1-L2.
Moderate disc height loss at L5-S1. No suspicious lytic or blastic
lesions. Bilateral SI joint and hip osteoarthritis.
IMPRESSION: No acute abdominopelvic abnormality.  Normal appendix.

Diverticulosis.  No evidence of diverticulitis.

## 2022-08-26 ENCOUNTER — Ambulatory Visit (INDEPENDENT_AMBULATORY_CARE_PROVIDER_SITE_OTHER): Payer: Self-pay | Admitting: Physician Assistant

## 2022-08-26 ENCOUNTER — Encounter: Payer: Self-pay | Admitting: Physician Assistant

## 2022-08-26 NOTE — Progress Notes (Signed)
Patient was scheduled to be seen. Unfortunately, clinic was running behind today and he could not wait to be seen. He left and stated that he would reschedule.  He was not seen by me.  Jarold Motto
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
## 2022-08-31 ENCOUNTER — Encounter: Payer: Self-pay | Admitting: Physician Assistant

## 2022-08-31 ENCOUNTER — Ambulatory Visit (INDEPENDENT_AMBULATORY_CARE_PROVIDER_SITE_OTHER): Payer: Self-pay | Admitting: Physician Assistant

## 2022-08-31 VITALS — BP 130/80 | HR 67 | Temp 98.2°F | Ht 70.0 in | Wt 176.0 lb

## 2022-08-31 DIAGNOSIS — B353 Tinea pedis: Secondary | ICD-10-CM

## 2022-08-31 DIAGNOSIS — F418 Other specified anxiety disorders: Secondary | ICD-10-CM

## 2022-08-31 DIAGNOSIS — M1990 Unspecified osteoarthritis, unspecified site: Secondary | ICD-10-CM

## 2022-08-31 DIAGNOSIS — R194 Change in bowel habit: Secondary | ICD-10-CM

## 2022-08-31 DIAGNOSIS — F17210 Nicotine dependence, cigarettes, uncomplicated: Secondary | ICD-10-CM

## 2022-08-31 MED ORDER — KETOCONAZOLE 2 % EX CREA: TOPICAL_CREAM | CUTANEOUS | 3 refills | Status: AC

## 2022-08-31 MED ORDER — HYDROXYZINE HCL 25 MG PO TABS: 25.0000 mg | ORAL_TABLET | Freq: Every evening | ORAL | 3 refills | Status: DC | PRN

## 2022-08-31 MED ORDER — TRAZODONE HCL 150 MG PO TABS: 150.0000 mg | ORAL_TABLET | Freq: Every evening | ORAL | 3 refills | Status: DC | PRN

## 2022-08-31 MED ORDER — IBUPROFEN 800 MG PO TABS: 800.0000 mg | ORAL_TABLET | Freq: Every day | ORAL | 3 refills | Status: AC | PRN
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
## 2022-11-11 ENCOUNTER — Ambulatory Visit: Payer: Self-pay | Admitting: Gastroenterology

## 2023-04-04 ENCOUNTER — Telehealth: Payer: Self-pay | Admitting: Acute Care

## 2023-04-04 ENCOUNTER — Other Ambulatory Visit: Payer: Self-pay

## 2023-04-04 DIAGNOSIS — F17210 Nicotine dependence, cigarettes, uncomplicated: Secondary | ICD-10-CM

## 2023-04-04 DIAGNOSIS — Z122 Encounter for screening for malignant neoplasm of respiratory organs: Secondary | ICD-10-CM

## 2023-04-04 DIAGNOSIS — Z87891 Personal history of nicotine dependence: Secondary | ICD-10-CM

## 2023-04-04 NOTE — Telephone Encounter (Signed)
 Lung Cancer Screening Narrative/Criteria Questionnaire (Cigarette Smokers Only- No Cigars/Pipes/vapes) Provider: Josette Christopher Angle     SDMV:04/24/23 at 10am         1960/07/21                          LDCT: 04/25/23 at 10:30am / GI at [address]    63 y.o.   Phone: [phone]  Lung Screening Narrative   Before calling, confirm age (50-77 yrs Medicare / 50-80 yrs Private pay insurance)  Insurance information: GRANT/BLUE CARD   I am calling at the request of Job (referring provider) to schedule you for a lung screening.  Did your provider discuss this with you? Yes   This screening involves an initial meeting with our NP, who is the Nurse Navigator for the program.  It is called a shared decision making visit.  The initial meeting is required by insurance and Medicare to make sure you understand the program.  This appointment takes about 20 minutes to complete.  After you have spoken with the provider, we will schedule you for your screening scan.  This scan takes about 5-10 minutes to complete.  You can eat and drink normally before and after the scan.    I am going to ask you a few questions to make sure you meet the criteria to participate in the program.    Are you a current or former smoker? Current Age began smoking: 63 yo   If you are a former smoker, what year did you quit smoking? (must be within 15 yrs)    To calculate your smoking history, I need an accurate estimate of how many packs of cigarettes you smoked per day and   for how many years.    Years smoking 47 x Packs per day 1 = Pack years 76   (at least 20 pack yrs)   (Make sure they understand that we need to know how much they have smoked in the past, not just the number of PPD they are smoking now)  Do you have a personal history of cancer?  No    Do you have a family history of cancer? No  Are you having any of the following symptoms?  Coughing up blood?  No  Weight loss of 15 lbs or more in the last  6 months without trying / you cannot explain  No  It looks like you meet all criteria.  When would be a good time for us  to schedule you for this screening?   Additional information: NA

## 2023-04-24 ENCOUNTER — Other Ambulatory Visit (INDEPENDENT_AMBULATORY_CARE_PROVIDER_SITE_OTHER): Payer: Self-pay | Admitting: Acute Care

## 2023-04-24 DIAGNOSIS — F17200 Nicotine dependence, unspecified, uncomplicated: Secondary | ICD-10-CM

## 2023-04-24 NOTE — Patient Instructions (Signed)

## 2023-04-24 NOTE — Progress Notes (Signed)
 Provider Attestation I agree with the documentation of the Shared Decision Making visit,  smoking cessation counseling if appropriate, and verification or eligibility for lung cancer screening as documented by the RN Nurse Navigator.   Raejean Bullock, MSN, AGACNP-BC [facility] See Amion for personal pager PCCM on call pager [phone]     Virtual Visit via Telephone Note  I connected with Todd Choi on 04/24/23 at 10:00 AM EST by telephone and verified that I am speaking with the correct person using two identifiers.  Location: Patient: in home Provider: [address] [place], [place], [address]   Shared Decision Making Visit Lung Cancer Screening Program [phone])  Eligibility: Age 63 y.o. Pack Years Smoking History Calculation 47 (# packs/per year x # years smoked) Recent History of coughing up blood  no Unexplained weight loss? no ( >Than 15 pounds within the last 6 months ) Prior History Lung / other cancer no (Diagnosis within the last 5 years already requiring surveillance chest CT Scans). Smoking Status Current Smoker Former Smokers: Years since quit:  NA  Quit Date: NA  Visit Components: Discussion included one or more decision making aids. yes Discussion included risk/benefits of screening. yes Discussion included potential follow up diagnostic testing for abnormal scans. yes Discussion included meaning and risk of over diagnosis. yes Discussion included meaning and risk of False Positives. yes Discussion included meaning of total radiation exposure. yes  Counseling Included: Importance of adherence to annual lung cancer LDCT screening. yes Impact of comorbidities on ability to participate in the program. yes Ability and willingness to under diagnostic treatment. yes  Smoking Cessation Counseling: Current Smokers:  Discussed importance of smoking cessation. yes Information about tobacco cessation classes and  interventions provided to patient. yes Patient provided with "ticket" for LDCT Scan. yes Symptomatic Patient. no  Counseling NA Diagnosis Code: Tobacco Use Z72.0 Asymptomatic Patient yes  Counseling (Intermediate counseling: > three minutes counseling) W2956 Former Smokers:  Discussed the importance of maintaining cigarette abstinence. yes Diagnosis Code: Personal History of Nicotine  Dependence. O13.086 Information about tobacco cessation classes and interventions provided to patient. Yes Patient provided with "ticket" for LDCT Scan. yes Written Order for Lung Cancer Screening with LDCT placed in Epic. Yes (CT Chest Lung Cancer Screening Low Dose W/O CM) [id number] Z12.2-Screening of respiratory organs Z87.891-Personal history of nicotine  dependence   Todd Gaskins, RN 04/24/23 Pt reminded to take blue card with them to appointment

## 2023-04-25 ENCOUNTER — Ambulatory Visit
Admission: RE | Admit: 2023-04-25 | Discharge: 2023-04-25 | Disposition: A | Discharge status: Home / Self Care | Payer: Self-pay | Source: Ambulatory Visit | Attending: Physician Assistant | Admitting: Physician Assistant

## 2023-04-25 DIAGNOSIS — Z122 Encounter for screening for malignant neoplasm of respiratory organs: Secondary | ICD-10-CM

## 2023-04-25 DIAGNOSIS — Z87891 Personal history of nicotine dependence: Secondary | ICD-10-CM

## 2023-04-25 DIAGNOSIS — F17210 Nicotine dependence, cigarettes, uncomplicated: Secondary | ICD-10-CM

## 2023-05-05 ENCOUNTER — Other Ambulatory Visit: Payer: Self-pay

## 2023-05-05 DIAGNOSIS — Z87891 Personal history of nicotine dependence: Secondary | ICD-10-CM

## 2023-05-05 DIAGNOSIS — Z122 Encounter for screening for malignant neoplasm of respiratory organs: Secondary | ICD-10-CM

## 2023-05-05 DIAGNOSIS — F17210 Nicotine dependence, cigarettes, uncomplicated: Secondary | ICD-10-CM

## 2023-11-22 ENCOUNTER — Telehealth: Payer: Self-pay | Admitting: Physician Assistant

## 2023-11-22 NOTE — Telephone Encounter (Signed)
 Patient's mother saw Dr. Micheal today--she states Dr. Micheal ok'd son Verba) to be a patient of Dr. Micheal.  I just need a verbal from [facility] to be able to schedule this appt.    Thanks!

## 2023-11-28 ENCOUNTER — Encounter: Payer: Self-pay | Admitting: Family Medicine

## 2023-12-04 NOTE — Telephone Encounter (Signed)
 Made pt a TOC appt on 11/23/23.  Pt cancelled appt on 11/24/23.

## 2023-12-11 ENCOUNTER — Telehealth: Payer: Self-pay

## 2023-12-11 NOTE — Telephone Encounter (Signed)
-----   Message from [name] sent at 12/11/2023  4:21 PM EDT ----- Danaysha Kirn,  Not sure yet if I will be back Wednesday, but looks like this patient was put into 15 minute slot as a new patient (new to us , but not [facility]).   Really need 30 minutes for him if we can get re-scheduled.     Thanks  Bruce

## 2023-12-11 NOTE — Telephone Encounter (Signed)
 I spoke with [employer] office supervisor and patient is going to be rescheduled for 30 min slot

## 2023-12-12 ENCOUNTER — Telehealth: Payer: Self-pay

## 2023-12-12 ENCOUNTER — Encounter: Payer: Self-pay | Admitting: Family Medicine

## 2023-12-12 NOTE — Telephone Encounter (Signed)
 I spoke with Candace front desk supervisor and she has been made aware to reschedule patient and block remaining same day slots

## 2023-12-13 ENCOUNTER — Encounter: Payer: Self-pay | Admitting: Family Medicine

## 2023-12-22 ENCOUNTER — Encounter: Payer: Self-pay | Admitting: Family Medicine

## 2023-12-22 ENCOUNTER — Ambulatory Visit (INDEPENDENT_AMBULATORY_CARE_PROVIDER_SITE_OTHER): Admitting: Family Medicine

## 2023-12-22 VITALS — BP 122/62 | HR 98 | Temp 97.6°F | Wt 176.6 lb

## 2023-12-22 DIAGNOSIS — F5104 Psychophysiologic insomnia: Secondary | ICD-10-CM

## 2023-12-22 DIAGNOSIS — R229 Localized swelling, mass and lump, unspecified: Secondary | ICD-10-CM | POA: Diagnosis not present

## 2023-12-22 DIAGNOSIS — K635 Polyp of colon: Secondary | ICD-10-CM | POA: Diagnosis not present

## 2023-12-22 DIAGNOSIS — F339 Major depressive disorder, recurrent, unspecified: Secondary | ICD-10-CM | POA: Diagnosis not present

## 2023-12-22 MED ORDER — TRIAMCINOLONE ACETONIDE 0.1 % EX CREA: 1.0000 | TOPICAL_CREAM | Freq: Two times a day (BID) | CUTANEOUS | 2 refills | Status: AC

## 2023-12-22 MED ORDER — TRAZODONE HCL 150 MG PO TABS: 150.0000 mg | ORAL_TABLET | Freq: Every evening | ORAL | 3 refills | Status: DC | PRN

## 2023-12-22 MED ORDER — HYDROXYZINE HCL 25 MG PO TABS: 25.0000 mg | ORAL_TABLET | Freq: Every evening | ORAL | 3 refills | Status: DC | PRN
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
## 2023-12-22 NOTE — Patient Instructions (Signed)
 Schedule complete physical soon.

## 2024-01-01 DIAGNOSIS — H40003 Preglaucoma, unspecified, bilateral: Secondary | ICD-10-CM | POA: Diagnosis not present

## 2024-01-03 ENCOUNTER — Encounter: Admitting: Family Medicine

## 2024-01-03 DIAGNOSIS — M5030 Other cervical disc degeneration, unspecified cervical region: Secondary | ICD-10-CM | POA: Diagnosis not present

## 2024-01-03 DIAGNOSIS — M25511 Pain in right shoulder: Secondary | ICD-10-CM | POA: Diagnosis not present

## 2024-01-04 ENCOUNTER — Encounter: Payer: Self-pay | Admitting: Family Medicine

## 2024-01-05 ENCOUNTER — Encounter: Admitting: Family Medicine

## 2024-01-08 ENCOUNTER — Ambulatory Visit (INDEPENDENT_AMBULATORY_CARE_PROVIDER_SITE_OTHER): Admitting: Family Medicine

## 2024-01-08 ENCOUNTER — Encounter: Payer: Self-pay | Admitting: Family Medicine

## 2024-01-08 VITALS — BP 126/84 | HR 97 | Temp 98.2°F | Ht 70.87 in | Wt 175.6 lb

## 2024-01-08 DIAGNOSIS — Z0000 Encounter for general adult medical examination without abnormal findings: Secondary | ICD-10-CM | POA: Diagnosis not present

## 2024-01-08 DIAGNOSIS — Z8349 Family history of other endocrine, nutritional and metabolic diseases: Secondary | ICD-10-CM
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
## 2024-01-09 ENCOUNTER — Ambulatory Visit (INDEPENDENT_AMBULATORY_CARE_PROVIDER_SITE_OTHER): Admitting: Family Medicine

## 2024-01-09 ENCOUNTER — Encounter: Payer: Self-pay | Admitting: Family Medicine

## 2024-01-09 VITALS — BP 130/80 | HR 88 | Temp 98.2°F | Wt 173.8 lb

## 2024-01-09 DIAGNOSIS — R229 Localized swelling, mass and lump, unspecified: Secondary | ICD-10-CM | POA: Diagnosis not present

## 2024-01-09 LAB — CBC WITH DIFFERENTIAL/PLATELET
Basophils Absolute: 0 K/uL (ref 0.0–0.1)
Basophils Relative: 0.4 % (ref 0.0–3.0)
Eosinophils Absolute: 0.2 K/uL (ref 0.0–0.7)
Eosinophils Relative: 1.8 % (ref 0.0–5.0)
HCT: 48.4 % (ref 39.0–52.0)
Hemoglobin: 16.2 g/dL (ref 13.0–17.0)
Lymphocytes Relative: 21.1 % (ref 12.0–46.0)
Lymphs Abs: 2 K/uL (ref 0.7–4.0)
MCHC: 33.4 g/dL (ref 30.0–36.0)
MCV: 91.4 fl (ref 78.0–100.0)
Monocytes Absolute: 0.8 K/uL (ref 0.1–1.0)
Monocytes Relative: 8.7 % (ref 3.0–12.0)
Neutro Abs: 6.4 K/uL (ref 1.4–7.7)
Neutrophils Relative %: 68 % (ref 43.0–77.0)
Platelets: 296 K/uL (ref 150.0–400.0)
RBC: 5.3 Mil/uL (ref 4.22–5.81)
RDW: 13.9 % (ref 11.5–15.5)
WBC: 9.4 K/uL (ref 4.0–10.5)

## 2024-01-09 LAB — LIPID PANEL
Cholesterol: 178 mg/dL (ref 0–200)
HDL: 50.3 mg/dL (ref >39.00)
LDL Cholesterol: 82 mg/dL (ref 0–99)
NonHDL: 128.05
Total CHOL/HDL Ratio: 4
Triglycerides: 229 mg/dL — ABNORMAL HIGH (ref 0.0–149.0)
VLDL: 45.8 mg/dL — ABNORMAL HIGH (ref 0.0–40.0)

## 2024-01-09 LAB — COMPREHENSIVE METABOLIC PANEL WITH GFR
ALT: 33 U/L (ref 0–53)
AST: 17 U/L (ref 0–37)
Albumin: 4.9 g/dL (ref 3.5–5.2)
Alkaline Phosphatase: 60 U/L (ref 39–117)
BUN: 22 mg/dL (ref 6–23)
CO2: 24 meq/L (ref 19–32)
Calcium: 9.2 mg/dL (ref 8.4–10.5)
Chloride: 105 meq/L (ref 96–112)
Creatinine, Ser: 0.74 mg/dL (ref 0.40–1.50)
GFR: 96.61 mL/min (ref >60.00)
Glucose, Bld: 117 mg/dL — ABNORMAL HIGH (ref 70–99)
Potassium: 4.3 meq/L (ref 3.5–5.1)
Sodium: 141 meq/L (ref 135–145)
Total Bilirubin: 0.3 mg/dL (ref 0.2–1.2)
Total Protein: 7.5 g/dL (ref 6.0–8.3)

## 2024-01-09 LAB — PSA: PSA: 1.07 ng/mL (ref 0.10–4.00)
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
## 2024-01-09 NOTE — Patient Instructions (Signed)
 Keep wound dry for the first 24 hours then clean daily with soap and water for one week. Apply vaseline daily for 4-5 days. Keep covered with clean dressing for 4-5 days. Follow up promptly for any signs of infection such as redness, warmth, pain, or drainage. Return in about 8 days for suture removal.

## 2024-01-10 ENCOUNTER — Ambulatory Visit: Payer: Self-pay | Admitting: Family Medicine

## 2024-01-10 LAB — DERMATOLOGY PATHOLOGY

## 2024-01-11 ENCOUNTER — Ambulatory Visit: Payer: Self-pay | Admitting: Family Medicine

## 2024-01-11 ENCOUNTER — Other Ambulatory Visit (HOSPITAL_COMMUNITY): Payer: Self-pay

## 2024-01-12 LAB — PE AND FLC, SERUM
A/G Ratio: 1.2 (ref 0.7–1.7)
Albumin ELP: 4.1 g/dL (ref 2.9–4.4)
Alpha 1: 0.2 g/dL (ref 0.0–0.4)
Alpha 2: 0.8 g/dL (ref 0.4–1.0)
Beta: 1.1 g/dL (ref 0.7–1.3)
Gamma Globulin: 1.2 g/dL (ref 0.4–1.8)
Globulin, Total: 3.3 g/dL (ref 2.2–3.9)
Ig Kappa Free Light Chain: 26.5 mg/L — ABNORMAL HIGH (ref 3.3–19.4)
Ig Lambda Free Light Chain: 17 mg/L (ref 5.7–26.3)
KAPPA/LAMBDA RATIO: 1.56 (ref 0.26–1.65)
Total Protein: 7.4 g/dL (ref 6.0–8.5)

## 2024-01-12 LAB — IMMUNOFIXATION, URINE

## 2024-01-18 ENCOUNTER — Encounter: Payer: Self-pay | Admitting: Family Medicine

## 2024-01-31 ENCOUNTER — Ambulatory Visit

## 2024-01-31 NOTE — Progress Notes (Unsigned)
 Pt's name and DOB verified at the beginning of the pre-visit with 2 identifiers  Permission given to speak with  Pt denies any difficulty with ambulating,sitting, laying down or rolling side to side  Pt has no issues moving head neck or swallowing  No egg or soy allergy known to patient   No issues known to pt with past sedation  No FH of Malignant Hyperthermia  Pt is not on home 02   Pt is not on blood thinners   Pt denies issues with constipation   Pt has frequent issues with constipation RN instructed pt to use Miralax per bottles instructions a week before prep days. Pt states they will  Pt is not on dialysis  Pt denise any abnormal heart rhythms   Pt denies any upcoming cardiac testing  Patient's chart reviewed by Norleen Schillings CNRA prior to pre-visit and patient appropriate for the [facility].  Pre-visit completed and red dot placed by patient's name on their procedure day (on provider's schedule).     Visit in person  Pt states weight is  Pt given  both [facility] main # and MD on call # prior to instructions.  Informed pt to come in at the time discussed and is shown on PV instructions.  Pt instructed to use Singlecare.com or GoodRx for a price reduction on prep  Instructed pt where to find PV instructions in My Ch. Copy of instructions  to be sent in mail and address read back to pt to verify correct on envelope. Instructed pt on all aspects of written instructions including med holds clothing to wear and foods to eat and not eat as well as after procedure legal restrictions and to call MD on call if needed.. Pt states understanding. Instructed pt to review instructions again prior to procedure and call main # given if has any questions or any issues. Pt states they will.

## 2024-02-14 ENCOUNTER — Encounter: Admitting: Internal Medicine

## 2024-03-01 ENCOUNTER — Telehealth: Payer: Self-pay | Admitting: Family Medicine

## 2024-03-01 DIAGNOSIS — R229 Localized swelling, mass and lump, unspecified: Secondary | ICD-10-CM

## 2024-03-01 NOTE — Telephone Encounter (Signed)
 Copied from CRM [id number]. Topic: Referral - Request for Referral >> Mar 01, 2024 12:24 PM Ashley R wrote: Did the patient discuss referral with their provider in the last year? Yes  Appointment offered? Yes, willing to schedule.   Type of order/referral and detailed reason for visit: Dermatologist  Preference of office, provider, location: Needs someone who accepts Medicaid, in [place].  If referral order, have you been seen by this specialty before? No  Can we respond through MyChart? No, callback [facility] [phone]

## 2024-03-02 NOTE — Telephone Encounter (Signed)
 My apologies.  I thought this (referral) had already been placed.    Wolm LELON Scarlet MD [facility]

## 2024-03-04 NOTE — Telephone Encounter (Signed)
 Patient informed of message below.

## 2024-04-16 ENCOUNTER — Encounter: Payer: Self-pay | Admitting: Family Medicine

## 2024-04-16 ENCOUNTER — Ambulatory Visit: Admitting: Family Medicine

## 2024-04-16 VITALS — BP 132/84 | HR 101 | Temp 98.4°F | Wt 177.0 lb

## 2024-04-16 DIAGNOSIS — G4700 Insomnia, unspecified: Secondary | ICD-10-CM | POA: Diagnosis not present

## 2024-04-16 DIAGNOSIS — R739 Hyperglycemia, unspecified: Secondary | ICD-10-CM

## 2024-04-16 DIAGNOSIS — R229 Localized swelling, mass and lump, unspecified: Secondary | ICD-10-CM

## 2024-04-16 LAB — POCT GLYCOSYLATED HEMOGLOBIN (HGB A1C): Hemoglobin A1C: 5.5 % (ref 4.0–5.6)

## 2024-04-16 MED ORDER — MUPIROCIN 2 % EX OINT: 1.0000 | TOPICAL_OINTMENT | Freq: Two times a day (BID) | CUTANEOUS | 1 refills | Status: AC

## 2024-04-16 MED ORDER — HYDROXYZINE HCL 25 MG PO TABS: 25.0000 mg | ORAL_TABLET | Freq: Every evening | ORAL | 3 refills | Status: AC | PRN

## 2024-04-16 MED ORDER — TRAZODONE HCL 150 MG PO TABS: 150.0000 mg | ORAL_TABLET | Freq: Every evening | ORAL | 3 refills | Status: AC | PRN
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
## 2024-04-16 NOTE — Patient Instructions (Signed)
 A1C today was 5.5 which is normal  I will repeat derm referral and try another practice and let me know if you don't hear back in 2-3 weeks.

## 2024-04-23 ENCOUNTER — Other Ambulatory Visit: Payer: Self-pay | Admitting: Family Medicine

## 2024-04-23 DIAGNOSIS — R229 Localized swelling, mass and lump, unspecified: Secondary | ICD-10-CM
